# Patient Record
Sex: Male | Born: 1950
Health system: Southern US, Community
[De-identification: ages and names within clinical notes are randomized; demographics above are authoritative.]

## PROBLEM LIST (undated history)

## (undated) ENCOUNTER — Emergency Department (HOSPITAL_COMMUNITY): Discharge status: Absent without leave | Payer: Medicare Other

## (undated) DIAGNOSIS — I10 Essential (primary) hypertension: Secondary | ICD-10-CM

## (undated) HISTORY — PX: JOINT REPLACEMENT: SHX530

---

## 1999-09-12 ENCOUNTER — Emergency Department (HOSPITAL_COMMUNITY): Admission: EM | Admit: 1999-09-12 | Discharge: 1999-09-12 | Payer: Self-pay | Admitting: *Deleted

## 2007-08-06 ENCOUNTER — Emergency Department (HOSPITAL_COMMUNITY): Admission: EM | Admit: 2007-08-06 | Discharge: 2007-08-06 | Payer: Self-pay | Admitting: Internal Medicine

## 2011-05-16 ENCOUNTER — Emergency Department (HOSPITAL_COMMUNITY): Payer: Medicare Other

## 2011-05-16 ENCOUNTER — Inpatient Hospital Stay (HOSPITAL_COMMUNITY)
Admission: EM | Admit: 2011-05-16 | Discharge: 2011-05-21 | DRG: 470 | Disposition: A | Discharge status: Skilled Nursing Facility | Payer: Medicare Other | Attending: Orthopedic Surgery | Admitting: Orthopedic Surgery

## 2011-05-16 DIAGNOSIS — K219 Gastro-esophageal reflux disease without esophagitis: Secondary | ICD-10-CM | POA: Diagnosis present

## 2011-05-16 DIAGNOSIS — S72002A Fracture of unspecified part of neck of left femur, initial encounter for closed fracture: Secondary | ICD-10-CM | POA: Diagnosis present

## 2011-05-16 DIAGNOSIS — S72009A Fracture of unspecified part of neck of unspecified femur, initial encounter for closed fracture: Principal | ICD-10-CM | POA: Diagnosis present

## 2011-05-16 DIAGNOSIS — I10 Essential (primary) hypertension: Secondary | ICD-10-CM | POA: Diagnosis present

## 2011-05-16 LAB — CBC
HCT: 38.2 % — ABNORMAL LOW (ref 39.0–52.0)
Hemoglobin: 12.5 g/dL — ABNORMAL LOW (ref 13.0–17.0)
MCH: 29.3 pg (ref 26.0–34.0)
MCHC: 32.7 g/dL (ref 30.0–36.0)
MCV: 89.7 fL (ref 78.0–100.0)
Platelets: 225 10*3/uL (ref 150–400)
RBC: 4.26 MIL/uL (ref 4.22–5.81)
RDW: 13.8 % (ref 11.5–15.5)
WBC: 20.8 10*3/uL — ABNORMAL HIGH (ref 4.0–10.5)

## 2011-05-16 LAB — DIFFERENTIAL
Basophils Absolute: 0 10*3/uL (ref 0.0–0.1)
Basophils Relative: 0 % (ref 0–1)
Eosinophils Absolute: 0 10*3/uL (ref 0.0–0.7)
Eosinophils Relative: 0 % (ref 0–5)
Lymphocytes Relative: 3 % — ABNORMAL LOW (ref 12–46)
Lymphs Abs: 0.5 10*3/uL — ABNORMAL LOW (ref 0.7–4.0)
Monocytes Absolute: 0.7 10*3/uL (ref 0.1–1.0)
Monocytes Relative: 3 % (ref 3–12)
Neutro Abs: 19.6 10*3/uL — ABNORMAL HIGH (ref 1.7–7.7)
Neutrophils Relative %: 94 % — ABNORMAL HIGH (ref 43–77)

## 2011-05-16 LAB — COMPREHENSIVE METABOLIC PANEL
ALT: 15 U/L (ref 0–53)
Alkaline Phosphatase: 71 U/L (ref 39–117)
BUN: 11 mg/dL (ref 6–23)
CO2: 24 mEq/L (ref 19–32)
GFR calc Af Amer: >90 mL/min (ref >90)
GFR calc non Af Amer: 78 mL/min — ABNORMAL LOW (ref >90)
Glucose, Bld: 118 mg/dL — ABNORMAL HIGH (ref 70–99)
Potassium: 3.3 mEq/L — ABNORMAL LOW (ref 3.5–5.1)
Sodium: 140 mEq/L (ref 135–145)
Total Bilirubin: 0.4 mg/dL (ref 0.3–1.2)
Total Protein: 6.5 g/dL (ref 6.0–8.3)

## 2011-05-16 LAB — APTT: aPTT: 30 s (ref 24–37)

## 2011-05-16 LAB — PROTIME-INR: Prothrombin Time: 14.4 seconds (ref 11.6–15.2)

## 2011-05-17 LAB — BASIC METABOLIC PANEL
BUN: 10 mg/dL (ref 6–23)
Chloride: 106 mEq/L (ref 96–112)
GFR calc Af Amer: >90 mL/min (ref >90)
GFR calc non Af Amer: 89 mL/min — ABNORMAL LOW (ref >90)
Potassium: 3.5 mEq/L (ref 3.5–5.1)
Sodium: 138 mEq/L (ref 135–145)

## 2011-05-17 LAB — CBC
HCT: 28.9 % — ABNORMAL LOW (ref 39.0–52.0)
MCHC: 33.6 g/dL (ref 30.0–36.0)
Platelets: 158 10*3/uL (ref 150–400)
RDW: 14 % (ref 11.5–15.5)
WBC: 15.4 10*3/uL — ABNORMAL HIGH (ref 4.0–10.5)
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
## 2011-05-18 LAB — BASIC METABOLIC PANEL
CO2: 21 mEq/L (ref 19–32)
Calcium: 7.6 mg/dL — ABNORMAL LOW (ref 8.4–10.5)
Chloride: 106 mEq/L (ref 96–112)
Glucose, Bld: 125 mg/dL — ABNORMAL HIGH (ref 70–99)
Potassium: 3.5 mEq/L (ref 3.5–5.1)
Sodium: 136 mEq/L (ref 135–145)

## 2011-05-18 LAB — CBC
Hemoglobin: 8 g/dL — ABNORMAL LOW (ref 13.0–17.0)
MCV: 88.6 fL (ref 78.0–100.0)
Platelets: 122 10*3/uL — ABNORMAL LOW (ref 150–400)
RBC: 2.72 MIL/uL — ABNORMAL LOW (ref 4.22–5.81)
WBC: 13 10*3/uL — ABNORMAL HIGH (ref 4.0–10.5)

## 2011-05-18 MED ORDER — DIPHENHYDRAMINE HCL 25 MG PO CAPS
25.0000 mg | ORAL_CAPSULE | ORAL | Status: DC | PRN
  Administered 2011-05-19 (×2): 25 mg via ORAL
  Filled 2011-05-18: qty 1

## 2011-05-18 MED ORDER — HYDROCODONE-ACETAMINOPHEN 5-325 MG PO TABS
1.0000 | ORAL_TABLET | ORAL | Status: DC | PRN
  Administered 2011-05-19: 1 via ORAL
  Administered 2011-05-19 – 2011-05-21 (×4): 2 via ORAL
  Filled 2011-05-18 (×4): qty 2

## 2011-05-18 MED ORDER — TEMAZEPAM 15 MG PO CAPS: 15.0000 mg | ORAL_CAPSULE | Freq: Every evening | ORAL | Status: DC | PRN

## 2011-05-18 MED ORDER — DOCUSATE SODIUM 100 MG PO CAPS
100.0000 mg | ORAL_CAPSULE | Freq: Two times a day (BID) | ORAL | Status: DC
  Administered 2011-05-19 – 2011-05-21 (×5): 100 mg via ORAL
  Filled 2011-05-18 (×6): qty 1

## 2011-05-18 MED ORDER — ONDANSETRON HCL 4 MG/2ML IJ SOLN: 4.0000 mg | Freq: Four times a day (QID) | INTRAMUSCULAR | Status: DC | PRN

## 2011-05-18 MED ORDER — ONDANSETRON HCL 4 MG PO TABS: 4.0000 mg | ORAL_TABLET | Freq: Four times a day (QID) | ORAL | Status: DC | PRN

## 2011-05-18 MED ORDER — SODIUM CHLORIDE 0.9 % IV SOLN
INTRAVENOUS | Status: DC
  Filled 2011-05-18 (×7): qty 1000

## 2011-05-18 MED ORDER — SENNOSIDES-DOCUSATE SODIUM 8.6-50 MG PO TABS: 1.0000 | ORAL_TABLET | Freq: Every evening | ORAL | Status: DC | PRN

## 2011-05-18 MED ORDER — ZOLPIDEM TARTRATE 5 MG PO TABS: 5.0000 mg | ORAL_TABLET | Freq: Every evening | ORAL | Status: DC | PRN

## 2011-05-18 MED ORDER — BISACODYL 10 MG RE SUPP: 10.0000 mg | Freq: Every day | RECTAL | Status: DC | PRN

## 2011-05-18 MED ORDER — HYDROMORPHONE HCL PF 1 MG/ML IJ SOLN: 0.5000 mg | INTRAMUSCULAR | Status: DC | PRN

## 2011-05-18 MED ORDER — PHENOL 1.4 % MT LIQD
1.0000 | OROMUCOSAL | Status: DC | PRN
Start: 2011-05-18 — End: 2011-05-21

## 2011-05-18 MED ORDER — METOCLOPRAMIDE HCL 10 MG PO TABS: 5.0000 mg | ORAL_TABLET | Freq: Three times a day (TID) | ORAL | Status: DC | PRN

## 2011-05-18 MED ORDER — FERROUS SULFATE 325 (65 FE) MG PO TABS
325.0000 mg | ORAL_TABLET | Freq: Three times a day (TID) | ORAL | Status: DC
  Administered 2011-05-19 – 2011-05-21 (×6): 325 mg via ORAL
  Filled 2011-05-18 (×14): qty 1

## 2011-05-18 MED ORDER — ACETAMINOPHEN 650 MG RE SUPP: 325.0000 mg | RECTAL | Status: DC | PRN

## 2011-05-18 MED ORDER — METHOCARBAMOL 500 MG PO TABS
500.0000 mg | ORAL_TABLET | Freq: Four times a day (QID) | ORAL | Status: DC | PRN
  Administered 2011-05-19 (×2): 500 mg via ORAL
  Filled 2011-05-18 (×2): qty 1

## 2011-05-18 MED ORDER — METOCLOPRAMIDE HCL 5 MG/ML IJ SOLN
10.0000 mg | Freq: Three times a day (TID) | INTRAMUSCULAR | Status: DC | PRN
  Filled 2011-05-18: qty 2

## 2011-05-18 MED ORDER — ACETAMINOPHEN 325 MG PO TABS: 325.0000 mg | ORAL_TABLET | ORAL | Status: DC | PRN

## 2011-05-18 MED ORDER — ENOXAPARIN SODIUM 40 MG/0.4ML ~~LOC~~ SOLN
40.0000 mg | SUBCUTANEOUS | Status: DC
  Administered 2011-05-19 – 2011-05-20 (×2): 40 mg via SUBCUTANEOUS
  Filled 2011-05-18 (×4): qty 0.4

## 2011-05-18 MED ORDER — METHOCARBAMOL 100 MG/ML IJ SOLN
500.0000 mg | Freq: Four times a day (QID) | INTRAVENOUS | Status: DC | PRN
  Filled 2011-05-18: qty 5

## 2011-05-18 MED ORDER — BISACODYL 5 MG PO TBEC: 10.0000 mg | DELAYED_RELEASE_TABLET | Freq: Every day | ORAL | Status: DC | PRN

## 2011-05-18 MED ORDER — MENTHOL 3 MG MT LOZG: 1.0000 | LOZENGE | OROMUCOSAL | Status: DC | PRN

## 2011-05-18 MED ORDER — FLEET ENEMA 7-19 GM/118ML RE ENEM: 1.0000 | ENEMA | Freq: Every day | RECTAL | Status: DC | PRN

## 2011-05-18 MED ORDER — ALUM & MAG HYDROXIDE-SIMETH 200-200-20 MG/5ML PO SUSP: 30.0000 mL | ORAL | Status: DC | PRN

## 2011-05-19 NOTE — Progress Notes (Signed)
Physical Therapy Treatment Patient Details Name: Leonard Green MRN: [id number] DOB: 04-30-1951 Today's Date: 05/19/2011  PT Assessment/Plan  PT - Assessment/Plan Comments on Treatment Session: Pt independently recalled 2/3 hip precautions.  Verbal cues required throughout session to maintain precautions with mobility/activity. PT Plan: Discharge plan remains appropriate;Frequency remains appropriate PT Frequency: 7X/week Follow Up Recommendations: Skilled nursing facility;Home health PT (pending progress) Equipment Recommended: Rolling walker with 5" wheels PT Goals  Acute Rehab PT Goals PT Goal Formulation: With patient Time For Goal Achievement: 7 days Pt will go Supine/Side to Sit: with supervision PT Goal: Supine/Side to Sit - Progress: Progressing toward goal Pt will Transfer Sit to Stand/Stand to Sit: with supervision PT Transfer Goal: Sit to Stand/Stand to Sit - Progress: Progressing toward goal Pt will Transfer Bed to Chair/Chair to Bed: with supervision PT Transfer Goal: Bed to Chair/Chair to Bed - Progress: Progressing toward goal Pt will Ambulate: >150 feet;with supervision;with rolling walker PT Goal: Ambulate - Progress: Progressing toward goal Additional Goals Additional Goal #1: Pt will be able to verbalize and follow 3/3 hip precaution goals 100% of time.  PT Treatment Precautions/Restrictions  Precautions Precautions: Posterior Hip Required Braces or Orthoses: Yes Knee Immobilizer: Other (comment) (when in bed) Restrictions Weight Bearing Restrictions: Yes LLE Weight Bearing: Weight bearing as tolerated Mobility (including Balance) Bed Mobility Bed Mobility: Yes Supine to Sit: 4: Min assist;HOB elevated (Comment degrees) (HOB 30 degrees) Supine to Sit Details (indicate cue type and reason): verbal/tactile cues for sequencing/precautions Transfers Transfers: Yes Sit to Stand: 4: Min assist Sit to Stand Details (indicate cue type and reason): verbal cues  for hand placement, tactile cues for forward weight shift Stand to Sit: 4: Min assist Stand to Sit Details: assist to slide left leg forward during descent, verbal cues for sequencing Ambulation/Gait Ambulation/Gait: Yes Ambulation/Gait Assistance: 4: Min assist Ambulation Distance (Feet): 110 Feet Assistive device: Rolling walker Gait Pattern: Antalgic Stairs: No Wheelchair Mobility Wheelchair Mobility: No    Exercise  Total Joint Exercises Ankle Circles/Pumps: AROM;Right;Left;10 reps;Supine Quad Sets: AROM;Left;10 reps;Supine (neuromuscular facilitation to complete) Heel Slides: AAROM;Left;10 reps;Supine Hip ABduction/ADduction: AAROM;Left;10 reps;Supine End of Session PT - End of Session Equipment Utilized During Treatment: Gait belt Activity Tolerance: Patient tolerated treatment well Patient left: in chair;with call bell in reach General Behavior During Session: [facility] for tasks performed Cognition: [facility] for tasks performed  Ilda Foil 05/19/2011, 9:02 AM

## 2011-05-19 NOTE — Progress Notes (Signed)
Physical Therapy Treatment Patient Details Name: Leonard Green MRN: [id number] DOB: 11-05-1950 Today's Date: 05/19/2011  PT Assessment/Plan  PT - Assessment/Plan Comments on Treatment Session: Reviewed 3/3 hip precautions.  Pt needs verbal cues during mobility to maintain precautions. PT Plan: Discharge plan remains appropriate PT Frequency: 7X/week Follow Up Recommendations: Skilled nursing facility;Home health PT (pending progress) Equipment Recommended: Rolling walker with 5" wheels PT Goals  Acute Rehab PT Goals PT Goal: Supine/Side to Sit - Progress: Other (comment) (did not assess) PT Transfer Goal: Sit to Stand/Stand to Sit - Progress: Progressing toward goal PT Transfer Goal: Bed to Chair/Chair to Bed - Progress: Progressing toward goal PT Goal: Ambulate - Progress: Progressing toward goal  PT Treatment Precautions/Restrictions  Precautions Precautions: Posterior Hip Required Braces or Orthoses: Yes Knee Immobilizer: Other (comment) (when in bed) Restrictions Weight Bearing Restrictions: Yes LLE Weight Bearing: Weight bearing as tolerated Mobility (including Balance) Transfers Sit to Stand: 3: Mod assist;From chair/3-in-1 Sit to Stand Details (indicate cue type and reason): verbal/tactile cues for technique/precautions Stand to Sit: 4: Min assist;To chair/3-in-1 Stand to Sit Details: verbal cues for sequencing Ambulation/Gait Ambulation/Gait: Yes Ambulation/Gait Assistance: 5: Supervision Ambulation Distance (Feet): 215 Feet Assistive device: Rolling walker    Exercise  Total Joint Exercises Ankle Circles/Pumps: AROM;Both;10 reps;Seated;Other (comment) (reclined) Quad Sets: AROM;Left;10 reps (neuromuscular facilitation to complete) Heel Slides: AAROM;Left;10 reps Hip ABduction/ADduction: AAROM;Left;10 reps End of Session PT - End of Session Equipment Utilized During Treatment: Gait belt Activity Tolerance: Patient tolerated treatment well Patient left: in  chair;with call bell in reach;with family/visitor present General Behavior During Session: [facility] for tasks performed Cognition: [facility] for tasks performed  Ilda Foil 05/19/2011, 1:59 PM

## 2011-05-19 NOTE — Progress Notes (Signed)
Leonard Green  MRN: [id number] DOB/Age: 11-04-1950 60 y.o. Physician: Jacquelyne Balint Procedure:       Subjective: Fair appetite and improving hip pain.  Vital Signs Temp:  [98.5 F (36.9 C)] 98.5 F (36.9 C) (11/04 0515) Pulse Rate:  [82] 82  (11/04 0515) Resp:  [22] 22  (11/04 0515) BP: (116)/(71) 116/71 mmHg (11/04 0515) SpO2:  [97 %] 97 % (11/04 0515)  Lab Results  Basename 05/18/11 0630 05/17/11 0608  WBC 13.0* 15.4*  HGB 8.0* 9.7*  HCT 24.1* 28.9*  PLT 122* 158   BMET  Basename 05/18/11 0630 05/17/11 0608  NA 136 138  K 3.5 3.5  CL 106 106  CO2 21 23  GLUCOSE 125* 156*  BUN 8 10  CREATININE 0.91 0.95  CALCIUM 7.6* 7.8*   INR  Date Value Range Status  05/16/2011 1.10  0.00-1.49 (no units) Final     Exam Dressing to left hip dry. Thigh soft NVI        Plan Mobilize with PT DC plans, needs social worker consult re: options   Leonard Green for Dr.Kevin Supple 05/19/2011, 9:42 AM

## 2011-05-20 NOTE — Progress Notes (Signed)
Met with patient and he is agreeable to SNF at d/c. Full CSW Assessment in shadow chart along with FL2 for MD signature.   Reece Levy, MSW, Theresia Majors [phone]

## 2011-05-20 NOTE — Progress Notes (Addendum)
Physical Therapy Treatment Patient Details Name: Leonard Green MRN: [id number] DOB: 1951/04/11 Today's Date: 05/20/2011  PT Assessment/Plan  PT - Assessment/Plan Comments on Treatment Session: Pt. able to verbalize 3/3 post hip precautions but required to maintain precautions with mobility. Pt ambulating well but still requring assitance for bed mobility and transfers. PT Plan: Discharge plan remains appropriate PT Goals  Acute Rehab PT Goals PT Goal: Supine/Side to Sit - Progress: Progressing toward goal PT Transfer Goal: Sit to Stand/Stand to Sit - Progress: Progressing toward goal PT Transfer Goal: Bed to Chair/Chair to Bed - Progress: Progressing toward goal PT Goal: Ambulate - Progress: Met  PT Treatment Precautions/Restrictions  Precautions Precautions: Posterior Hip Required Braces or Orthoses: No Knee Immobilizer: Other (comment) (when in bed) Restrictions Weight Bearing Restrictions: Yes LLE Weight Bearing: Weight bearing as tolerated Mobility (including Balance) Bed Mobility Bed Mobility: Yes Supine to Sit: 4: Min assist Supine to Sit Details (indicate cue type and reason): cues to keep LLE straight and not turned in. A to position LLE. Transfers Transfers: Yes Sit to Stand: 3: Mod assist;From bed Sit to Stand Details (indicate cue type and reason): A to inititate stand. Pt having difficulty Stand to Sit: 4: Min assist Stand to Sit Details: Pt required cues and assistance to slide out LLE.  Ambulation/Gait Ambulation/Gait: Yes Ambulation/Gait Assistance: 5: Supervision Ambulation/Gait Assistance Details (indicate cue type and reason): Cues for upright posture and safe positioning of RW Ambulation Distance (Feet): 225 Feet Assistive device: Rolling walker Gait Pattern: Step-through pattern;Antalgic Gait velocity: Pt with decreased cadence. Stairs: No Wheelchair Mobility Wheelchair Mobility: No    Exercise  Total Joint Exercises Ankle Circles/Pumps:  AROM;10 reps;Seated;Left Quad Sets: Seated;10 reps;AROM;Strengthening;Left Heel Slides: AAROM;Strengthening;10 reps;Seated;Left Hip ABduction/ADduction: AAROM;Strengthening;Left;10 reps;Seated Long Arc Quad: Strengthening;AROM;Left;10 reps;Seated End of Session PT - End of Session Equipment Utilized During Treatment: Gait belt Activity Tolerance: Patient tolerated treatment well Patient left: in chair;with call bell in reach Nurse Communication: Mobility status for ambulation General Behavior During Session: [facility] for tasks performed Cognition: [facility] for tasks performed  Leonard Green, Leonard Green 05/20/2011, 10:46 AM

## 2011-05-21 DIAGNOSIS — S72002A Fracture of unspecified part of neck of left femur, initial encounter for closed fracture: Secondary | ICD-10-CM | POA: Diagnosis present

## 2011-05-21 MED ORDER — DSS 100 MG PO CAPS: 100.0000 mg | ORAL_CAPSULE | Freq: Two times a day (BID) | ORAL | Status: AC

## 2011-05-21 MED ORDER — BISACODYL 5 MG PO TBEC: 10.0000 mg | DELAYED_RELEASE_TABLET | Freq: Every day | ORAL | Status: AC | PRN

## 2011-05-21 MED ORDER — METHOCARBAMOL 500 MG PO TABS: 500.0000 mg | ORAL_TABLET | Freq: Four times a day (QID) | ORAL | Status: AC | PRN

## 2011-05-21 MED ORDER — ACETAMINOPHEN 325 MG PO TABS: 325.0000 mg | ORAL_TABLET | ORAL | Status: AC | PRN

## 2011-05-21 MED ORDER — SENNOSIDES-DOCUSATE SODIUM 8.6-50 MG PO TABS: 1.0000 | ORAL_TABLET | Freq: Every evening | ORAL | Status: AC | PRN

## 2011-05-21 MED ORDER — HYDROCODONE-ACETAMINOPHEN 5-325 MG PO TABS: 1.0000 | ORAL_TABLET | ORAL | Status: AC | PRN

## 2011-05-21 MED ORDER — DIPHENHYDRAMINE HCL 25 MG PO CAPS: 25.0000 mg | ORAL_CAPSULE | ORAL | Status: AC | PRN

## 2011-05-21 MED ORDER — MENTHOL 3 MG MT LOZG: 1.0000 | LOZENGE | OROMUCOSAL | Status: AC | PRN

## 2011-05-21 MED ORDER — ENOXAPARIN SODIUM 40 MG/0.4ML ~~LOC~~ SOLN: 40.0000 mg | SUBCUTANEOUS | Status: AC

## 2011-05-21 MED ORDER — HYDROCODONE-ACETAMINOPHEN 5-325 MG PO TABS: 1.0000 | ORAL_TABLET | ORAL | Status: DC | PRN

## 2011-05-21 MED ORDER — ALUM & MAG HYDROXIDE-SIMETH 200-200-20 MG/5ML PO SUSP: 30.0000 mL | ORAL | Status: AC | PRN

## 2011-05-21 MED ORDER — FERROUS SULFATE 325 (65 FE) MG PO TABS: 325.0000 mg | ORAL_TABLET | Freq: Three times a day (TID) | ORAL | Status: DC

## 2011-05-21 MED ORDER — TEMAZEPAM 15 MG PO CAPS: 15.0000 mg | ORAL_CAPSULE | Freq: Every evening | ORAL | Status: AC | PRN

## 2011-05-21 NOTE — Progress Notes (Signed)
Patient for d/c today to SNF bed at [facility]. Patient's mother assisting with paperwork at SNF and with Medicaid application. Plan transfer via EMS. Reece Levy, MSW, Theresia Majors [phone]

## 2011-05-21 NOTE — Discharge Summary (Signed)
Physician Discharge Summary  Patient ID: Leonard Green MRN: [id number] DOB/AGE: Apr 17, 1951 60 y.o.  Admit date: 05/16/2011 Discharge date: 05/21/2011  Admission Diagnoses: Left femoral neck fracture   Discharge Diagnoses: Left Femoral neck fracture Principal Problem:  *Fracture of femoral neck, left   Discharged Condition: good  Hospital Course: Admitted for left hip fracture, 05/16/2011 and taken to the operating room for a THR based on his age and condition.  After routine stay in the recovery room he was transferred to the Orthopedic floor where he remained for his whole hospital stay.  On post op day 1 his foley catheter and HV drain were removed.  He was seen and evaluated by PT/OT.   There were no major complicating events.  He did not receive nor need a transfusion.  By post op day 5 arrangements had been made by Social Work to be transferred to the [facility].  His wound was clean and dry.  Consults: none   Treatments: surgery:  Left total hip replacement, 05/16/2011  Discharge Exam: Blood pressure 106/45, pulse 75, temperature 99.5 F (37.5 C), temperature source Oral, resp. rate 16, height 5\' 5"  (1.651 m), weight 61.23 kg (134 lb 15.8 oz), SpO2 100.00%.   Disposition: To SNF  Discharge Orders    Future Orders Please Complete By Expires   Diet - low sodium heart healthy      Constipation Prevention      Comments:   Drink plenty of fluids.  Prune juice may be helpful.  You may use a stool softener, such as Colace (over the counter) 100 mg twice a day.  Use MiraLax (over the counter) for constipation as needed.   Increase activity slowly as tolerated      Weight Bearing as taught in Physical Therapy      Comments:   Use a walker or crutches as instructed.   Call MD / Call 911      Comments:   If you experience chest pain or shortness of breath, CALL 911 and be transported to the hospital emergency room.  If you develope a fever above 101 F, pus (white  drainage) or increased drainage or redness at the wound, or calf pain, call your surgeon's office.   Discharge instructions      Comments:   Keep wound dry until follow up.  Maintain surgical dressing for 8 days postoperative, then remove and cover with gauze and tape.   Driving restrictions      Comments:   No driving for 4 weeks   Do not sit on low chairs, stoools or toilet seats, as it may be difficult to get up from low surfaces      DO NOT drive, shower or take a tub bath until instructed by your physician      Discharge wound care:      Comments:   If you have a hip bandage, keep it clean and dry.  Change your bandage as instructed by your health care providers.  If your bandage has been discontinued, keep your incision clean and dry.  Pat dry after bathing.  DO NOT put lotion or powder on your incision.     Current Discharge Medication List    START taking these medications   Details  acetaminophen (TYLENOL) 325 MG tablet Take 1-2 tablets (325-650 mg total) by mouth every 4 (four) hours as needed. Qty: 30 tablet, Refills: 0    alum & mag hydroxide-simeth (MAALOX/MYLANTA) 200-200-20 MG/5ML suspension Take 30 mLs by  mouth every 4 (four) hours as needed. Qty: 355 mL, Refills: 0    bisacodyl (DULCOLAX) 5 MG EC tablet Take 2 tablets (10 mg total) by mouth daily as needed for constipation. Qty: 30 tablet, Refills: 0    diphenhydrAMINE (BENADRYL) 25 mg capsule Take 1 capsule (25 mg total) by mouth every 4 (four) hours as needed for itching. Qty: 30 capsule, Refills: 0    docusate sodium 100 MG CAPS Take 100 mg by mouth 2 (two) times daily. Qty: 60 capsule, Refills: 0    enoxaparin (LOVENOX) 40 MG/0.4ML SOLN Inject 0.4 mLs (40 mg total) into the skin daily. Qty: 11.2 mL, Refills: 0    ferrous sulfate 325 (65 FE) MG tablet Take 1 tablet (325 mg total) by mouth 3 (three) times daily after meals. Qty: 90 tablet, Refills: 0    HYDROcodone-acetaminophen (NORCO) 5-325 MG per tablet  Take 1-2 tablets by mouth every 4 (four) hours as needed. Qty: 100 tablet, Refills: 0    menthol-cetylpyridinium (CEPACOL) 3 MG lozenge Take 1 lozenge (3 mg total) by mouth as needed. Qty: 100 tablet, Refills: 0    methocarbamol (ROBAXIN) 500 MG tablet Take 1 tablet (500 mg total) by mouth every 6 (six) hours as needed. Qty: 90 tablet, Refills: 1    senna-docusate (SENOKOT-S) 8.6-50 MG per tablet Take 1 tablet by mouth at bedtime as needed for constipation. Qty: 30 tablet, Refills: 0    temazepam (RESTORIL) 15 MG capsule Take 1-2 capsules (15-30 mg total) by mouth at bedtime as needed for sleep. Qty: 30 capsule, Refills: 0       Follow-up Information    Follow up with Shayda Kalka D. Call in 2 weeks.   Contact information:   [facility] [address], [address] [place] [address] [phone]          Signed: [name] 05/21/2011, 3:25 PM

## 2011-05-21 NOTE — Progress Notes (Signed)
Occupational Therapy Treatment Patient Details Name: Leonard Green MRN: [id number] DOB: 01/31/1951 Today's Date: 05/21/2011  OT Assessment/Plan OT Assessment/Plan Comments on Treatment Session: Pt progressing well towards goals. Pt plans to d/c to SNF this afternoon. OT Plan: Discharge plan remains appropriate OT Goals    OT Treatment Precautions/Restrictions  Precautions Precautions: Posterior Hip Precaution Comments: Pt able to verbalize 3/3 posterior hip precautions without cues Required Braces or Orthoses: No Restrictions Weight Bearing Restrictions: Yes LLE Weight Bearing: Weight bearing as tolerated   ADL ADL Toilet Transfer: Performed;Other (comment) (Min guard assist) Toilet Transfer Method: Proofreader: Raised toilet seat with arms (or 3-in-1 over toilet) Toileting - Clothing Manipulation: Performed;Supervision/safety Toileting - Clothing Manipulation Details (indicate cue type and reason): Pt pulled up gown for use of urinal while standing at toilet Where Assessed - Glass blower/designer Manipulation: Standing Toileting - Hygiene: Performed;Supervision/safety Toileting - Hygiene Details (indicate cue type and reason): Pt held urinal while standing with UE support on RW with supervision Where Assessed - Toileting Hygiene: Standing Equipment Used: Rolling walker ADL Comments: Pt performed toileting task while standing with supervision with urinal. Pt performed toilet transfer to 3-in-1 over toilet in prep for toileting ADL. Mobility  Bed Mobility Bed Mobility: No Transfers Transfers: Yes Sit to Stand: 4: Min assist;From chair/3-in-1 Stand to Sit: Other (comment);To chair/3-in-1 (Pt performed stand to sit to chair/3-in1 with min guard) Exercises    End of Session OT - End of Session Equipment Utilized During Treatment: Gait belt Activity Tolerance: Patient limited by pain Patient left: in chair;with call bell in reach;with  family/visitor present General Behavior During Session: [facility] for tasks performed Cognition: [facility] for tasks performed  Cipriano Mile  05/21/2011, 1:06 PM

## 2011-05-21 NOTE — Progress Notes (Signed)
  POD # 5 from Left THR for femoral neck fracture  Subjective: Doing well.  No events or major concerns. Reviewed procedure/components.  Ready for transfer to facility to continue working on progress   Objective: Vital signs in last 24 hours: Temp:  [99.2 F (37.3 C)-99.9 F (37.7 C)] 99.9 F (37.7 C) (11/06 0543) Pulse Rate:  [75-79] 75  (11/06 0543) Resp:  [18] 18  (11/06 0543) BP: (111-142)/(54-73) 142/68 mmHg (11/06 0543) SpO2:  [96 %-99 %] 99 % (11/06 0543)  No results found for this basename: HGB:5 in the last 72 hours No results found for this basename: WBC:2,RBC:2,HCT:2,PLT:2 in the last 72 hours No results found for this basename: NA:2,K:2,CL:2,CO2:2,BUN:2,CREATININE:2,GLUCOSE:2,CALCIUM:2 in the last 72 hours No results found for this basename: LABPT:2,INR:2 in the last 72 hours  Neurovascular intact Dorsiflexion/Plantar flexion intact Incision: dressing C/D/I  Assessment/Plan: Doing well.  Plan is to transfer to SNF today pending discharge coordination   Royalty Fakhouri D 05/21/2011, 7:43 AM

## 2011-05-21 NOTE — Progress Notes (Signed)
Pt D/Cd to [facility] via EMS.

## 2011-05-21 NOTE — Progress Notes (Signed)
Physical Therapy Treatment Patient Details Name: Leonard Green MRN: [id number] DOB: [birth year] Today's Date: 05/21/2011  PT Assessment/Plan  PT - Assessment/Plan Comments on Treatment Session: Pt. continuing to require assistance with bed mobility and transfers. Pt is hopeful for D/C to [facility] this afternoon PT Plan: Discharge plan remains appropriate PT Goals  Acute Rehab PT Goals PT Goal: Supine/Side to Sit - Progress: Progressing toward goal PT Transfer Goal: Sit to Stand/Stand to Sit - Progress: Progressing toward goal PT Transfer Goal: Bed to Chair/Chair to Bed - Progress: Progressing toward goal PT Goal: Ambulate - Progress: Met  PT Treatment Precautions/Restrictions  Precautions Precautions: Posterior Hip Precaution Comments: Pt able to verbalize 3/3 posterior hip precautions without cues Required Braces or Orthoses: No Knee Immobilizer: Other (comment) (when in bed) Restrictions Weight Bearing Restrictions: Yes LLE Weight Bearing: Weight bearing as tolerated Mobility (including Balance) Bed Mobility Supine to Sit: 4: Min assist;HOB elevated (Comment degrees) (25 degrees) Supine to Sit Details (indicate cue type and reason): A required to position LLE. Cues for safe technique. Pt. with heavy reliance on rails. C/O increase elbow pain secondary to fall.  Transfers Transfers: Yes Sit to Stand: 4: Min assist Sit to Stand Details (indicate cue type and reason): A to initate stand and cues for safe hand placement Stand to Sit: 4: Min assist Stand to Sit Details: Cues to slide out LLE.  Ambulation/Gait Ambulation/Gait: Yes Ambulation/Gait Assistance: 5: Supervision Ambulation/Gait Assistance Details (indicate cue type and reason): Cues to roll RW vs. picking walker up. Cues to look forward. Ambulation Distance (Feet): 225 Feet Assistive device: Rolling walker Gait Pattern: Step-through pattern;Antalgic    Exercise  Total Joint Exercises Heel Slides:  AAROM;Strengthening;Left;15 reps;Seated Hip ABduction/ADduction: Seated;15 reps;Left;Strengthening;AAROM Long Arc Quad: AAROM;Strengthening;Left;15 reps;Seated End of Session PT - End of Session Equipment Utilized During Treatment: Gait belt Activity Tolerance: Patient tolerated treatment well Patient left: in chair;with call bell in reach General Behavior During Session: [facility] for tasks performed Cognition: [facility] for tasks performed  Fredrich Birks 05/21/2011, 8:48 AM

## 2016-12-19 DIAGNOSIS — H9041 Sensorineural hearing loss, unilateral, right ear, with unrestricted hearing on the contralateral side: Secondary | ICD-10-CM | POA: Diagnosis not present

## 2017-03-26 DIAGNOSIS — H04123 Dry eye syndrome of bilateral lacrimal glands: Secondary | ICD-10-CM | POA: Diagnosis not present

## 2017-03-26 DIAGNOSIS — H35033 Hypertensive retinopathy, bilateral: Secondary | ICD-10-CM | POA: Diagnosis not present

## 2018-02-21 ENCOUNTER — Emergency Department (HOSPITAL_COMMUNITY): Payer: PPO

## 2018-02-21 ENCOUNTER — Encounter (HOSPITAL_COMMUNITY): Payer: Self-pay

## 2018-02-21 ENCOUNTER — Emergency Department (HOSPITAL_COMMUNITY)
Admission: EM | Admit: 2018-02-21 | Discharge: 2018-02-21 | Disposition: A | Discharge status: Home / Self Care | Payer: PPO | Attending: Emergency Medicine | Admitting: Emergency Medicine

## 2018-02-21 ENCOUNTER — Other Ambulatory Visit: Payer: Self-pay

## 2018-02-21 DIAGNOSIS — Z96649 Presence of unspecified artificial hip joint: Secondary | ICD-10-CM | POA: Diagnosis not present

## 2018-02-21 DIAGNOSIS — Y929 Unspecified place or not applicable: Secondary | ICD-10-CM | POA: Diagnosis not present

## 2018-02-21 DIAGNOSIS — Y939 Activity, unspecified: Secondary | ICD-10-CM | POA: Diagnosis not present

## 2018-02-21 DIAGNOSIS — Z23 Encounter for immunization: Secondary | ICD-10-CM | POA: Diagnosis not present

## 2018-02-21 DIAGNOSIS — W260XXA Contact with knife, initial encounter: Secondary | ICD-10-CM | POA: Diagnosis not present

## 2018-02-21 DIAGNOSIS — M79672 Pain in left foot: Secondary | ICD-10-CM | POA: Diagnosis not present

## 2018-02-21 DIAGNOSIS — M722 Plantar fascial fibromatosis: Secondary | ICD-10-CM

## 2018-02-21 DIAGNOSIS — Y999 Unspecified external cause status: Secondary | ICD-10-CM | POA: Diagnosis not present

## 2018-02-21 DIAGNOSIS — S61412A Laceration without foreign body of left hand, initial encounter: Secondary | ICD-10-CM | POA: Diagnosis not present

## 2018-02-21 MED ORDER — TETANUS-DIPHTH-ACELL PERTUSSIS 5-2.5-18.5 LF-MCG/0.5 IM SUSP
0.5000 mL | Freq: Once | INTRAMUSCULAR | Status: AC
  Administered 2018-02-21: 0.5 mL via INTRAMUSCULAR
  Filled 2018-02-21: qty 0.5

## 2018-02-21 NOTE — ED Notes (Signed)
Hemostatic gauze applied

## 2018-02-21 NOTE — ED Triage Notes (Signed)
Pt states that he accidentally cut his L hand this morning with a pocket knife, bleeding controlled, last tetanus 20 years ago

## 2018-02-21 NOTE — Discharge Instructions (Addendum)
Leave dressing in place for the next 2 days.  Try shoe inserts to help with heel pain.  Schedule appointment with primary care for evaluation of blood pressure

## 2018-02-23 NOTE — ED Provider Notes (Signed)
[facility] Provider Note   CSN: [id number] Arrival date & time: 02/21/18  2048     History   Chief Complaint Chief Complaint  Patient presents with  . Extremity Laceration    HPI Leonard HartiganClyde S Lehnert is a 67 y.o. male.  The history is provided by the patient. No language interpreter was used.  Hand Pain  This is a new problem. The current episode started 12 to 24 hours ago. The problem occurs constantly. The problem has not changed since onset.Nothing aggravates the symptoms. Nothing relieves the symptoms. He has tried nothing for the symptoms.  Pt reports he cut hand with a pocket knife.  Pt reports area has been bleeding all day Pt request a tetanus shot  Pt also complains of foot pain for several months.  Pt reports his heel hurts when he walks.  History reviewed. No pertinent past medical history.  Patient Active Problem List   Diagnosis Date Noted  . Fracture of femoral neck, left (HCC) 05/21/2011    Past Surgical History:  Procedure Laterality Date  . JOINT REPLACEMENT     hip        Home Medications    Prior to Admission medications   Medication Sig Start Date End Date Taking? Authorizing Provider  ferrous sulfate 325 (65 FE) MG tablet Take 1 tablet (325 mg total) by mouth 3 (three) times daily after meals. 05/21/11 05/20/12  [name], Matthew, MD    Family History No family history on file.  Social History Social History   Tobacco Use  . Smoking status: Never Smoker  . Smokeless tobacco: Never Used  Substance Use Topics  . Alcohol use: Never    Frequency: Never  . Drug use: Never     Allergies   Patient has no known allergies.   Review of Systems Review of Systems  Skin: Positive for wound.  All other systems reviewed and are negative.    Physical Exam Updated Vital Signs BP (!) 180/99 (BP Location: Right Arm)   Pulse 69   Temp 98.7 F (37.1 C) (Oral)   Resp 16   SpO2 100%   Physical Exam    Constitutional: He is oriented to person, place, and time. He appears well-developed and well-nourished.  Musculoskeletal: He exhibits no deformity.  3mm superficial laceration  Oozing  nv and ns intact.   Neurological: He is alert and oriented to person, place, and time.  Skin: Skin is dry.  Nursing note and vitals reviewed.    ED Treatments / Results  Labs (all labs ordered are listed, but only abnormal results are displayed) Labs Reviewed - No data to display  EKG None  Radiology Dg Foot Complete Left  Result Date: 02/21/2018 CLINICAL DATA:  Chronic left foot pain, particularly at the heel. Initial encounter. EXAM: LEFT FOOT - COMPLETE 3+ VIEW COMPARISON:  None. FINDINGS: There is no evidence of fracture or dislocation. The joint spaces are preserved. There is no evidence of talar subluxation; the subtalar joint is unremarkable in appearance. A small plantar calcaneal spur is noted. No significant soft tissue abnormalities are seen. IMPRESSION: No evidence of fracture or dislocation. Electronically Signed   By: Roanna RaiderJeffery  Chang M.D.   On: 02/21/2018 22:22    Procedures Procedures (including critical care time)  Medications Ordered in ED Medications  Tdap (BOOSTRIX) injection 0.5 mL (0.5 mLs Intramuscular Given 02/21/18 2215)     Initial Impression / Assessment and Plan / ED Course  I have reviewed  the triage vital signs and the nursing notes.  Pertinent labs & imaging results that were available during my care of the patient were reviewed by me and considered in my medical decision making (see chart for details).     Foot no sign of infection,  Pain with walking    MDM  Wound small, dirty,   Area cleaned,  Thrombipad applied and bleeding stopped.  Pt advised to leave dressing on x 24 hours.    Final Clinical Impressions(s) / ED Diagnoses   Final diagnoses:  Laceration of left hand, foreign body presence unspecified, initial encounter  Plantar fasciitis, left    ED  Discharge Orders    None    An After Visit Summary was printed and given to the patient.    Elson AreasSofia, Christo Hain K, PA-C 02/23/18 0016    Sabas SousBero, Michael M, MD 02/23/18 [phone]

## 2018-08-05 ENCOUNTER — Encounter (HOSPITAL_COMMUNITY): Payer: Self-pay | Admitting: Emergency Medicine

## 2018-08-05 ENCOUNTER — Ambulatory Visit (HOSPITAL_COMMUNITY)
Admission: EM | Admit: 2018-08-05 | Discharge: 2018-08-05 | Disposition: A | Discharge status: Home / Self Care | Payer: PPO | Attending: Family Medicine | Admitting: Family Medicine

## 2018-08-05 DIAGNOSIS — I10 Essential (primary) hypertension: Secondary | ICD-10-CM | POA: Insufficient documentation

## 2018-08-05 DIAGNOSIS — M545 Low back pain, unspecified: Secondary | ICD-10-CM

## 2018-08-05 DIAGNOSIS — S39012A Strain of muscle, fascia and tendon of lower back, initial encounter: Secondary | ICD-10-CM

## 2018-08-05 HISTORY — DX: Essential (primary) hypertension: I10

## 2018-08-05 MED ORDER — CYCLOBENZAPRINE HCL 5 MG PO TABS: ORAL_TABLET | ORAL | 0 refills | Status: DC

## 2018-08-05 NOTE — ED Triage Notes (Signed)
Pt here for lower back pain x 4 days

## 2018-08-05 NOTE — Discharge Instructions (Addendum)

## 2018-08-05 NOTE — ED Provider Notes (Signed)
[facility]   [id number] 08/05/18 Arrival Time: [id number]  ASSESSMENT & PLAN:  1. Acute left-sided low back pain without sciatica   2. Strain of lumbar region, initial encounter   3. Uncontrolled hypertension    Able to ambulate here and hemodynamically stable. No indication for imaging of back at this time given no trauma and normal neurological exam. Discussed.  Clinical Course as of Aug 06 1143  Wed Aug 05, 2018  1144 Noted and discussed at visit. Historical data shows previously elevated blood pressures. Recommend establishing care with a PCP. May check outside of clinic and return with log to discuss initiation of anti-hypertensive treatment if he wishes.  BP(!): 204/91 [BH]    Clinical Course User Index [BH] Mardella LaymanHagler, Luverne Zerkle, MD   Meds ordered this encounter  Medications  . cyclobenzaprine (FLEXERIL) 5 MG tablet    Sig: Take 1 tablet by mouth before bed as needed for muscle spasm. Warning: May cause drowsiness.    Dispense:  10 tablet    Refill:  0   Suspect muscle spasm. Medication sedation precautions given. Encourage ROM/movement as tolerated.  He does plan to call for new PCP appointment asap. Written information on HTN given. See AVS for discharge instructions.  Reviewed expectations re: course of current medical issues. Questions answered. Outlined signs and symptoms indicating need for more acute intervention. Patient verbalized understanding. After Visit Summary given.   SUBJECTIVE: History from: patient. 68yo male. 4 days; LBP; lumbar; central and left; no radiation; ache with occasional "shock" feeling; no injury/trauma; noticed after stepping into truck; ibup s relief; no extremity sensation changes or weakness from baseline (polio as child); normal bowel/bladder habits; 2 y ago similar back pain that resolved without treatment; ambulatory without difficulty; no pregressive LE weakness or saddle anesthesia; no n/v; OTC analgesics without  relief  Reports no chronic steroid use, fevers, IV drug use, or recent back surgeries or procedures.  Also notice increased BP. Without headaches, CP, SOB, LE edema, PND, orthopnea.  ROS: As per HPI. All other systems negative    OBJECTIVE:  Vitals:   08/05/18 1051  BP: (!) 204/91  Pulse: 60  Resp: 18  Temp: 97.7 F (36.5 C)  TempSrc: Oral  SpO2: 99%    General appearance: alert; no distress Neck: supple with FROM; without midline tenderness CV: RRR Lungs: CTAB Abdomen: soft, non-tender; non-distended Back: mild to moderate bilateral tenderness of his lower paraspinal musculature; FROM at waist; bruising: none; without midline tenderness Extremities: no edema; symmetrical with no gross deformities; normal ROM of bilateral lower extremities Skin: warm and dry Neurologic: normal gait; normal reflexes of RLE and LLE; normal sensation of RLE and LLE; normal strength of RLE and LLE Psychological: alert and cooperative; normal mood and affect   No Known Allergies  Past Medical History:  Diagnosis Date  . Hypertension    Social History   Socioeconomic History  . Marital status: Single    Spouse name: Not on file  . Number of children: Not on file  . Years of education: Not on file  . Highest education level: Not on file  Occupational History  . Not on file  Social Needs  . Financial resource strain: Not on file  . Food insecurity:    Worry: Not on file    Inability: Not on file  . Transportation needs:    Medical: Not on file    Non-medical: Not on file  Tobacco Use  . Smoking status: Never Smoker  . Smokeless  tobacco: Never Used  Substance and Sexual Activity  . Alcohol use: Never    Frequency: Never  . Drug use: Never  . Sexual activity: Not on file  Lifestyle  . Physical activity:    Days per week: Not on file    Minutes per session: Not on file  . Stress: Not on file  Relationships  . Social connections:    Talks on phone: Not on file    Gets  together: Not on file    Attends religious service: Not on file    Active member of club or organization: Not on file    Attends meetings of clubs or organizations: Not on file    Relationship status: Not on file  . Intimate partner violence:    Fear of current or ex partner: Not on file    Emotionally abused: Not on file    Physically abused: Not on file    Forced sexual activity: Not on file  Other Topics Concern  . Not on file  Social History Narrative  . Not on file   History reviewed. No pertinent family history. Past Surgical History:  Procedure Laterality Date  . JOINT REPLACEMENT     hip     Mardella LaymanHagler, Tayo Maute, MD 08/06/18 [phone]

## 2019-05-31 ENCOUNTER — Encounter (HOSPITAL_COMMUNITY): Payer: Self-pay

## 2019-05-31 ENCOUNTER — Ambulatory Visit (HOSPITAL_COMMUNITY)
Admission: EM | Admit: 2019-05-31 | Discharge: 2019-05-31 | Disposition: A | Discharge status: Home / Self Care | Payer: PPO | Attending: Internal Medicine | Admitting: Internal Medicine

## 2019-05-31 ENCOUNTER — Other Ambulatory Visit: Payer: Self-pay

## 2019-05-31 ENCOUNTER — Ambulatory Visit (INDEPENDENT_AMBULATORY_CARE_PROVIDER_SITE_OTHER): Payer: PPO

## 2019-05-31 DIAGNOSIS — S8012XA Contusion of left lower leg, initial encounter: Secondary | ICD-10-CM

## 2019-05-31 DIAGNOSIS — M7989 Other specified soft tissue disorders: Secondary | ICD-10-CM | POA: Diagnosis not present

## 2019-05-31 DIAGNOSIS — M25572 Pain in left ankle and joints of left foot: Secondary | ICD-10-CM | POA: Diagnosis not present

## 2019-05-31 DIAGNOSIS — L03116 Cellulitis of left lower limb: Secondary | ICD-10-CM

## 2019-05-31 DIAGNOSIS — S99912A Unspecified injury of left ankle, initial encounter: Secondary | ICD-10-CM | POA: Diagnosis not present

## 2019-05-31 MED ORDER — DOXYCYCLINE HYCLATE 100 MG PO CAPS: 100.0000 mg | ORAL_CAPSULE | Freq: Two times a day (BID) | ORAL | 0 refills | Status: DC

## 2019-05-31 NOTE — ED Provider Notes (Signed)
[facility]    CSN: [id number] Arrival date & time: 05/31/19  1254      History   Chief Complaint Chief Complaint  Patient presents with  . Hand Pain  . Leg Pain    HPI Leonard HartiganClyde S Green is a 68 y.o. male.   Leonard Green S Lindor presents with complaints of pain and redness to left lower leg. Two weeks ago while cutting a tree branch it ended up falling and landing on his left lower leg. Abrasion occurred and pain. Pain has not improved at all and now with redness to the lower leg. He recently noted some bruising to left foot, although has no pain and states that the branch did not land on his foot. He is not on a blood thinner not taking any medications currently, doesn't follow with a PCP. No numbness or tingling. Has been ambulatory. Originally had more pain with weight bearing but this has improved some. Denies any previous similar. No known MRSA history.    ROS per HPI, negative if not otherwise mentioned.      Past Medical History:  Diagnosis Date  . Hypertension     Patient Active Problem List   Diagnosis Date Noted  . Fracture of femoral neck, left (HCC) 05/21/2011    Past Surgical History:  Procedure Laterality Date  . JOINT REPLACEMENT     hip       Home Medications    Prior to Admission medications   Medication Sig Start Date End Date Taking? Authorizing Provider  cyclobenzaprine (FLEXERIL) 5 MG tablet Take 1 tablet by mouth before bed as needed for muscle spasm. Warning: May cause drowsiness. 08/05/18   Mardella LaymanHagler, Brian, MD  doxycycline (VIBRAMYCIN) 100 MG capsule Take 1 capsule (100 mg total) by mouth 2 (two) times daily. 05/31/19   Leonard Green, Leonard Yeung B, NP  ferrous sulfate 325 (65 FE) MG tablet Take 1 tablet (325 mg total) by mouth 3 (three) times daily after meals. 05/21/11 05/20/12  Leonard Green, Matthew, MD    Family History Family History  Problem Relation Age of Onset  . Cancer Mother   . Asthma Father   . Cancer Father     Social History Social  History   Tobacco Use  . Smoking status: Never Smoker  . Smokeless tobacco: Never Used  Substance Use Topics  . Alcohol use: Never    Frequency: Never  . Drug use: Never     Allergies   Patient has no known allergies.   Review of Systems Review of Systems   Physical Exam Triage Vital Signs ED Triage Vitals  Enc Vitals Group     BP 05/31/19 1340 (!) 171/124     Pulse Rate 05/31/19 1340 62     Resp 05/31/19 1340 16     Temp 05/31/19 1340 98.3 F (36.8 C)     Temp Source 05/31/19 1340 Oral     SpO2 05/31/19 1340 100 %     Weight --      Height --      Head Circumference --      Peak Flow --      Pain Score 05/31/19 1338 3     Pain Loc --      Pain Edu? --      Excl. in GC? --    No data found.  Updated Vital Signs BP (!) 171/124 (BP Location: Right Arm)   Pulse 62   Temp 98.3 F (36.8 C) (Oral)   Resp 16  SpO2 100%   Visual Acuity Right Eye Distance:   Left Eye Distance:   Bilateral Distance:    Right Eye Near:   Left Eye Near:    Bilateral Near:     Physical Exam Constitutional:      Appearance: He is well-developed.  Cardiovascular:     Rate and Rhythm: Normal rate.  Pulmonary:     Effort: Pulmonary effort is normal.  Musculoskeletal:     Left ankle: He exhibits swelling. He exhibits normal range of motion, no ecchymosis, no deformity and no laceration. Tenderness. Lateral malleolus tenderness found. Achilles tendon normal.     Left foot: Normal range of motion. No tenderness or bony tenderness.       Feet:     Comments: Ecchymosis noted to dorsum of left foot at distal 2nd and 3rd metatarsals without tenderness; left ankle with tenderness and mild swelling noted to the lateral malleolus; left lower leg with redness swelling and warmth surrounding abrasions to anterior shin; see photos; no drainage   Skin:    General: Skin is warm and dry.  Neurological:     Mental Status: He is alert and oriented to person, place, and time.           UC Treatments / Results  Labs (all labs ordered are listed, but only abnormal results are displayed) Labs Reviewed - No data to display  EKG   Radiology Dg Ankle Complete Left  Result Date: 05/31/2019 CLINICAL DATA:  Left ankle pain and swelling after injury. EXAM: LEFT ANKLE COMPLETE - 3+ VIEW COMPARISON:  None. FINDINGS: There is no evidence of fracture, dislocation, or joint effusion. There is no evidence of arthropathy or other focal bone abnormality. Mild soft tissue swelling is seen over lateral malleolus. IMPRESSION: No fracture or dislocation is noted. Mild soft tissue swelling is seen over lateral malleolus. Electronically Signed   By: Lupita Raider M.D.   On: 05/31/2019 14:34    Procedures Procedures (including critical care time)  Medications Ordered in UC Medications - No data to display  Initial Impression / Assessment and Plan / UC Course  I have reviewed the triage vital signs and the nursing notes.  Pertinent labs & imaging results that were available during my care of the patient were reviewed by me and considered in my medical decision making (see chart for details).    Ankle films without acute findings. Foot is non tender and without red flag findings. Left lower leg concerning for cellulitis with antibiotics provided. Return precautions provided. Noted elevated BP. Encouraged establish with pcp for recheck and management. Patient verbalized understanding and agreeable to plan.  Ambulatory out of clinic without difficulty.    Final Clinical Impressions(s) / UC Diagnoses   Final diagnoses:  Cellulitis of left lower extremity  Contusion of left lower extremity, initial encounter     Discharge Instructions     It does appear that you have infection to the skin of your left lower leg.  Please complete course of antibiotics.  Tylenol as needed for pain.  Please follow up with a primary care provider for recheck and management of your blood pressure.  Any  worsening of pain, redness, fevers, or otherwise worsening please return to be evaluated    ED Prescriptions    Medication Sig Dispense Auth. Provider   doxycycline (VIBRAMYCIN) 100 MG capsule Take 1 capsule (100 mg total) by mouth 2 (two) times daily. 20 capsule Leonard Haber, NP     PDMP  not reviewed this encounter.   Zigmund Gottron, NP 05/31/19 2001

## 2019-05-31 NOTE — Discharge Instructions (Addendum)
It does appear that you have infection to the skin of your left lower leg.  Please complete course of antibiotics.  Tylenol as needed for pain.  Please follow up with a primary care provider for recheck and management of your blood pressure.  Any worsening of pain, redness, fevers, or otherwise worsening please return to be evaluated

## 2019-05-31 NOTE — ED Triage Notes (Signed)
Patient presents to Urgent Care with complaints of right hand and left leg pain since having difficulty cutting branches off a tree. Patient reports it hurt his right hand and left lower leg. Pt reports redness and swelling goes from mid-shin all the way to his toes, pt denies altered sensation to foot.

## 2019-07-15 DIAGNOSIS — Z9189 Other specified personal risk factors, not elsewhere classified: Secondary | ICD-10-CM | POA: Diagnosis not present

## 2019-07-15 DIAGNOSIS — Z20828 Contact with and (suspected) exposure to other viral communicable diseases: Secondary | ICD-10-CM | POA: Diagnosis not present

## 2019-07-15 DIAGNOSIS — Z03818 Encounter for observation for suspected exposure to other biological agents ruled out: Secondary | ICD-10-CM | POA: Diagnosis not present

## 2020-01-17 ENCOUNTER — Ambulatory Visit (HOSPITAL_COMMUNITY)
Admission: EM | Admit: 2020-01-17 | Discharge: 2020-01-17 | Disposition: A | Discharge status: Home / Self Care | Payer: PPO | Attending: Family Medicine | Admitting: Family Medicine

## 2020-01-17 ENCOUNTER — Encounter (HOSPITAL_COMMUNITY): Payer: Self-pay

## 2020-01-17 ENCOUNTER — Other Ambulatory Visit: Payer: Self-pay

## 2020-01-17 DIAGNOSIS — H6123 Impacted cerumen, bilateral: Secondary | ICD-10-CM

## 2020-01-17 DIAGNOSIS — R42 Dizziness and giddiness: Secondary | ICD-10-CM

## 2020-01-17 MED ORDER — MECLIZINE HCL 12.5 MG PO TABS: 12.5000 mg | ORAL_TABLET | Freq: Three times a day (TID) | ORAL | 0 refills | Status: DC | PRN

## 2020-01-17 NOTE — Discharge Instructions (Signed)
We have cleaned out the wax in your ears today  You may use a solution of half water half peroxide to keep your ears clear  If this is not working, I have also sent in meclizine to your pharmacy.  You may take this 3 times a day as needed for dizziness  Follow-up as needed

## 2020-01-17 NOTE — ED Provider Notes (Signed)
[facility]   [id number] 01/17/20 Arrival Time: 1749  CC: EAR PAIN  SUBJECTIVE: History from: patient.  Leonard Green is a 69 y.o. male who presents with of bilateral ear fullness for the last 3 days.  Also reports dizziness for the last 3 days.  Denies a precipitating event, such as swimming or wearing ear plugs. Patient states the pain is constant and achy in character.  Has not taken any OTC medications for this.  Reports that when he is having an episode of dizziness, feels like the room is spinning.  Symptoms are made worse with lying down. Denies fever, chills, fatigue, sinus pain, rhinorrhea, ear discharge, sore throat, SOB, wheezing, chest pain, nausea, changes in bowel or bladder habits.    ROS: As per HPI.  All other pertinent ROS negative.     Past Medical History:  Diagnosis Date  . Hypertension    Past Surgical History:  Procedure Laterality Date  . JOINT REPLACEMENT     hip   No Known Allergies No current facility-administered medications on file prior to encounter.   Current Outpatient Medications on File Prior to Encounter  Medication Sig Dispense Refill  . cyclobenzaprine (FLEXERIL) 5 MG tablet Take 1 tablet by mouth before bed as needed for muscle spasm. Warning: May cause drowsiness. 10 tablet 0  . doxycycline (VIBRAMYCIN) 100 MG capsule Take 1 capsule (100 mg total) by mouth 2 (two) times daily. 20 capsule 0  . ferrous sulfate 325 (65 FE) MG tablet Take 1 tablet (325 mg total) by mouth 3 (three) times daily after meals. 90 tablet 0   Social History   Socioeconomic History  . Marital status: Single    Spouse name: Not on file  . Number of children: Not on file  . Years of education: Not on file  . Highest education level: Not on file  Occupational History  . Not on file  Tobacco Use  . Smoking status: Never Smoker  . Smokeless tobacco: Never Used  Vaping Use  . Vaping Use: Never used  Substance and Sexual Activity  . Alcohol use: Never  .  Drug use: Never  . Sexual activity: Not on file  Other Topics Concern  . Not on file  Social History Narrative  . Not on file   Social Determinants of Health   Financial Resource Strain:   . Difficulty of Paying Living Expenses:   Food Insecurity:   . Worried About Programme researcher, broadcasting/film/video in the Last Year:   . Barista in the Last Year:   Transportation Needs:   . Freight forwarder (Medical):   Marland Kitchen Lack of Transportation (Non-Medical):   Physical Activity:   . Days of Exercise per Week:   . Minutes of Exercise per Session:   Stress:   . Feeling of Stress :   Social Connections:   . Frequency of Communication with Friends and Family:   . Frequency of Social Gatherings with Friends and Family:   . Attends Religious Services:   . Active Member of Clubs or Organizations:   . Attends Banker Meetings:   Marland Kitchen Marital Status:   Intimate Partner Violence:   . Fear of Current or Ex-Partner:   . Emotionally Abused:   Marland Kitchen Physically Abused:   . Sexually Abused:    Family History  Problem Relation Age of Onset  . Cancer Mother   . Asthma Father   . Cancer Father     OBJECTIVE:  Vitals:   01/17/20 1815  BP: (!) 174/82  Pulse: (!) 51  Resp: 17  Temp: 98.6 F (37 C)  TempSrc: Oral  SpO2: 100%     General appearance: alert; appears fatigued HEENT: Ears: Bilateral cerumen impaction, TMs pearly gray with visible cone of light, without erythema; Eyes: PERRL, EOMI grossly; Sinuses nontender to palpation; Nose: clear rhinorrhea; Throat: oropharynx mildly erythematous, tonsils 1+ without white tonsillar exudates, uvula midline Neck: supple without LAD Lungs: unlabored respirations, symmetrical air entry; cough: absent; no respiratory distress Heart: regular rate and rhythm.  Radial pulses 2+ symmetrical bilaterally Skin: warm and dry Psychological: alert and cooperative; normal mood and affect  Imaging: No results found.   ASSESSMENT & PLAN:  1. Dizziness    2. Bilateral impacted cerumen     Meds ordered this encounter  Medications  . meclizine (ANTIVERT) 12.5 MG tablet    Sig: Take 1 tablet (12.5 mg total) by mouth 3 (three) times daily as needed for dizziness.    Dispense:  30 tablet    Refill:  0    Order Specific Question:   Supervising Provider    Answer:   Merrilee Jansky [[id number]]   Prescribed meclizine in case of vertigo If symptoms do not improve with irrigation alone, you may take meclizine 3 times daily as needed dizziness Rest and drink plenty of fluids Ear wash in office today Bilateral EACs clear after irrigation Take medications as directed and to completion Continue to use OTC ibuprofen and/ or tylenol as needed for pain control Follow up with PCP if symptoms persists Return here or go to the ER if you have any new or worsening symptoms   Reviewed expectations re: course of current medical issues. Questions answered. Outlined signs and symptoms indicating need for more acute intervention. Patient verbalized understanding. After Visit Summary given.          Moshe Cipro, NP 01/17/20 1937

## 2020-01-17 NOTE — ED Triage Notes (Signed)
Pt reports he feels the room is "spinning" on and off for the past 2 days. Worsens when moving the head to the side or bend over. Denies headache, nausea.

## 2021-06-14 ENCOUNTER — Ambulatory Visit (HOSPITAL_COMMUNITY)
Admission: EM | Admit: 2021-06-14 | Discharge: 2021-06-14 | Disposition: A | Discharge status: Home / Self Care | Payer: Medicare Other | Attending: Internal Medicine | Admitting: Internal Medicine

## 2021-06-14 ENCOUNTER — Encounter (HOSPITAL_COMMUNITY): Payer: Self-pay | Admitting: Emergency Medicine

## 2021-06-14 ENCOUNTER — Other Ambulatory Visit: Payer: Self-pay

## 2021-06-14 DIAGNOSIS — H6192 Disorder of left external ear, unspecified: Secondary | ICD-10-CM

## 2021-06-14 DIAGNOSIS — B9689 Other specified bacterial agents as the cause of diseases classified elsewhere: Secondary | ICD-10-CM

## 2021-06-14 DIAGNOSIS — H109 Unspecified conjunctivitis: Secondary | ICD-10-CM | POA: Diagnosis not present

## 2021-06-14 MED ORDER — OLOPATADINE HCL 0.1 % OP SOLN: 1.0000 [drp] | Freq: Two times a day (BID) | OPHTHALMIC | 12 refills | Status: AC

## 2021-06-14 MED ORDER — ERYTHROMYCIN 5 MG/GM OP OINT: TOPICAL_OINTMENT | Freq: Two times a day (BID) | OPHTHALMIC | 0 refills | Status: AC

## 2021-06-14 NOTE — ED Triage Notes (Signed)
Pt reports was racking leaves on MOnday and reports that later that night pinkish-red in color having drainage and itching.  Pt also wants left ear looked at that has been having a sore on it for 5 years. Pt doesn't have a PCP.

## 2021-06-14 NOTE — ED Provider Notes (Signed)
[facility]    CSN: [id number] Arrival date & time: 06/14/21  0945      History   Chief Complaint Chief Complaint  Patient presents with   Conjunctivitis    HPI Leonard Green is a 70 y.o. male comes to the urgent care with 3 to 4 days history of itchy watery eyes.  Patient's symptoms started insidiously and has been persistent.  He raked leaves on Monday prior to onset of the symptoms.  He has been rubbing his eyes.  He denies any light sensitivity.  He endorses crusting of his eyelids.  No fever or chills.  No sore throat, cough or sputum production.  No double vision or blurry vision.  Patient complains of skin lesion on the left ear of 5 years duration.  Lesion is nonhealing and bleeds frequently with minimal trauma.  Patient has not been evaluated in the past.   HPI  Past Medical History:  Diagnosis Date   Hypertension     Patient Active Problem List   Diagnosis Date Noted   Fracture of femoral neck, left ([place]) 05/21/2011    Past Surgical History:  Procedure Laterality Date   JOINT REPLACEMENT     hip       Home Medications    Prior to Admission medications   Medication Sig Start Date End Date Taking? Authorizing Provider  erythromycin ophthalmic ointment Place into both eyes in the morning and at bedtime for 5 days. Place a 1/2 inch ribbon of ointment into the lower eyelid. 06/14/21 06/19/21 Yes Kaelan Emami, Myrene Galas, MD  olopatadine (PATANOL) 0.1 % ophthalmic solution Place 1 drop into both eyes 2 (two) times daily for 10 days. 06/14/21 06/24/21 Yes Johne Buckle, Myrene Galas, MD    Family History Family History  Problem Relation Age of Onset   Cancer Mother    Asthma Father    Cancer Father     Social History Social History   Tobacco Use   Smoking status: Never   Smokeless tobacco: Never  Vaping Use   Vaping Use: Never used  Substance Use Topics   Alcohol use: Never   Drug use: Never     Allergies   Patient has no known allergies.   Review  of Systems Review of Systems  HENT: Negative.    Eyes:  Positive for discharge, redness and itching. Negative for photophobia, pain and visual disturbance.  Skin:  Positive for wound. Negative for color change and pallor.    Physical Exam Triage Vital Signs ED Triage Vitals  Enc Vitals Group     BP 06/14/21 1116 (!) 166/93     Pulse Rate 06/14/21 1116 65     Resp 06/14/21 1116 18     Temp 06/14/21 1116 98.9 F (37.2 C)     Temp Source 06/14/21 1116 Oral     SpO2 06/14/21 1116 98 %     Weight --      Height --      Head Circumference --      Peak Flow --      Pain Score 06/14/21 1114 5     Pain Loc --      Pain Edu? --      Excl. in [place]? --    No data found.  Updated Vital Signs BP (!) 166/93 (BP Location: Right Arm)   Pulse 65   Temp 98.9 F (37.2 C) (Oral)   Resp 18   SpO2 98%   Visual Acuity Right Eye Distance:  Left Eye Distance:   Bilateral Distance:    Right Eye Near:   Left Eye Near:    Bilateral Near:     Physical Exam Vitals and nursing note reviewed.  Constitutional:      General: He is not in acute distress.    Appearance: He is not ill-appearing.  HENT:     Right Ear: Tympanic membrane normal.     Left Ear: Tympanic membrane normal.  Eyes:     Pupils: Pupils are equal, round, and reactive to light.     Comments: Bilateral conjunctival erythema with purulent discharge.  Extraocular movements intact.    Cardiovascular:     Rate and Rhythm: Normal rate and regular rhythm.  Neurological:     Mental Status: He is alert.     UC Treatments / Results  Labs (all labs ordered are listed, but only abnormal results are displayed) Labs Reviewed - No data to display  EKG   Radiology No results found.  Procedures Procedures (including critical care time)  Medications Ordered in UC Medications - No data to display  Initial Impression / Assessment and Plan / UC Course  I have reviewed the triage vital signs and the nursing  notes.  Pertinent labs & imaging results that were available during my care of the patient were reviewed by me and considered in my medical decision making (see chart for details).     1.  Acute bacterial conjunctivitis of both eyes: Erythromycin ointment twice daily for 5 days Patanol eyedrops as needed for itching Return to urgent care if you have blurry vision, double vision or light sensitivity.  2.  Skin lesion on the left ear: [facility] dermatology follow-up recommended Usually rebiopsy of the lesion given the duration of the lesion. Final Clinical Impressions(s) / UC Diagnoses   Final diagnoses:  Bacterial conjunctivitis of both eyes  Skin lesion of left external ear     Discharge Instructions      Please use medications as prescribed If symptoms worsen please return to urgent care to be reevaluated Please make an appointment with a dermatologist to have the skin lesion on the left ear evaluated.  You will need a biopsy of that lesion on the ear.    ED Prescriptions     Medication Sig Dispense Auth. Provider   erythromycin ophthalmic ointment Place into both eyes in the morning and at bedtime for 5 days. Place a 1/2 inch ribbon of ointment into the lower eyelid. 3.5 g Seynabou Fults, Britta Mccreedy, MD   olopatadine (PATANOL) 0.1 % ophthalmic solution Place 1 drop into both eyes 2 (two) times daily for 10 days. 5 mL Aniket Paye, Britta Mccreedy, MD      PDMP not reviewed this encounter.   Merrilee Jansky, MD 06/14/21 [phone]

## 2021-06-14 NOTE — Discharge Instructions (Addendum)
Please use medications as prescribed If symptoms worsen please return to urgent care to be reevaluated Please make an appointment with a dermatologist to have the skin lesion on the left ear evaluated.  You will need a biopsy of that lesion on the ear.

## 2021-12-27 ENCOUNTER — Emergency Department (HOSPITAL_COMMUNITY): Payer: Medicare Other

## 2021-12-27 ENCOUNTER — Emergency Department (HOSPITAL_COMMUNITY)
Admission: EM | Admit: 2021-12-27 | Discharge: 2021-12-27 | Disposition: A | Discharge status: Home / Self Care | Payer: Medicare Other | Attending: Emergency Medicine | Admitting: Emergency Medicine

## 2021-12-27 ENCOUNTER — Encounter (HOSPITAL_COMMUNITY): Payer: Self-pay

## 2021-12-27 ENCOUNTER — Other Ambulatory Visit: Payer: Self-pay

## 2021-12-27 DIAGNOSIS — R112 Nausea with vomiting, unspecified: Secondary | ICD-10-CM | POA: Insufficient documentation

## 2021-12-27 DIAGNOSIS — R29810 Facial weakness: Secondary | ICD-10-CM | POA: Diagnosis not present

## 2021-12-27 DIAGNOSIS — I10 Essential (primary) hypertension: Secondary | ICD-10-CM

## 2021-12-27 DIAGNOSIS — I161 Hypertensive emergency: Secondary | ICD-10-CM | POA: Diagnosis not present

## 2021-12-27 DIAGNOSIS — B91 Sequelae of poliomyelitis: Secondary | ICD-10-CM | POA: Insufficient documentation

## 2021-12-27 DIAGNOSIS — R2681 Unsteadiness on feet: Secondary | ICD-10-CM

## 2021-12-27 DIAGNOSIS — H81312 Aural vertigo, left ear: Secondary | ICD-10-CM

## 2021-12-27 DIAGNOSIS — R42 Dizziness and giddiness: Secondary | ICD-10-CM | POA: Diagnosis not present

## 2021-12-27 DIAGNOSIS — R9431 Abnormal electrocardiogram [ECG] [EKG]: Secondary | ICD-10-CM | POA: Diagnosis not present

## 2021-12-27 DIAGNOSIS — E876 Hypokalemia: Secondary | ICD-10-CM

## 2021-12-27 DIAGNOSIS — R1112 Projectile vomiting: Secondary | ICD-10-CM

## 2021-12-27 DIAGNOSIS — Z85820 Personal history of malignant melanoma of skin: Secondary | ICD-10-CM | POA: Insufficient documentation

## 2021-12-27 LAB — DIFFERENTIAL
Abs Immature Granulocytes: 0.03 10*3/uL (ref 0.00–0.07)
Basophils Absolute: 0.1 10*3/uL (ref 0.0–0.1)
Basophils Relative: 1 %
Eosinophils Absolute: 0.1 10*3/uL (ref 0.0–0.5)
Eosinophils Relative: 1 %
Immature Granulocytes: 0 %
Lymphocytes Relative: 21 %
Lymphs Abs: 1.7 10*3/uL (ref 0.7–4.0)
Monocytes Absolute: 0.6 10*3/uL (ref 0.1–1.0)
Monocytes Relative: 7 %
Neutro Abs: 6 10*3/uL (ref 1.7–7.7)
Neutrophils Relative %: 70 %

## 2021-12-27 LAB — COMPREHENSIVE METABOLIC PANEL
ALT: 11 U/L (ref 0–44)
AST: 18 U/L (ref 15–41)
Albumin: 3.7 g/dL (ref 3.5–5.0)
Alkaline Phosphatase: 70 U/L (ref 38–126)
Anion gap: 15 (ref 5–15)
BUN: 17 mg/dL (ref 8–23)
CO2: 20 mmol/L — ABNORMAL LOW (ref 22–32)
Calcium: 8.9 mg/dL (ref 8.9–10.3)
Chloride: 104 mmol/L (ref 98–111)
Creatinine, Ser: 1.18 mg/dL (ref 0.61–1.24)
GFR, Estimated: >60 mL/min (ref >60)
Glucose, Bld: 154 mg/dL — ABNORMAL HIGH (ref 70–99)
Potassium: 3.3 mmol/L — ABNORMAL LOW (ref 3.5–5.1)
Sodium: 139 mmol/L (ref 135–145)
Total Bilirubin: 0.6 mg/dL (ref 0.3–1.2)
Total Protein: 6.8 g/dL (ref 6.5–8.1)

## 2021-12-27 LAB — MAGNESIUM: Magnesium: 2 mg/dL (ref 1.7–2.4)

## 2021-12-27 LAB — I-STAT CHEM 8, ED
BUN: 16 mg/dL (ref 8–23)
Calcium, Ion: 1.06 mmol/L — ABNORMAL LOW (ref 1.15–1.40)
Chloride: 105 mmol/L (ref 98–111)
Creatinine, Ser: 1.1 mg/dL (ref 0.61–1.24)
Glucose, Bld: 156 mg/dL — ABNORMAL HIGH (ref 70–99)
HCT: 41 % (ref 39.0–52.0)
Hemoglobin: 13.9 g/dL (ref 13.0–17.0)
Potassium: 3.3 mmol/L — ABNORMAL LOW (ref 3.5–5.1)
Sodium: 138 mmol/L (ref 135–145)
TCO2: 20 mmol/L — ABNORMAL LOW (ref 22–32)

## 2021-12-27 LAB — CBC
HCT: 40.5 % (ref 39.0–52.0)
Hemoglobin: 14 g/dL (ref 13.0–17.0)
MCH: 31 pg (ref 26.0–34.0)
MCHC: 34.6 g/dL (ref 30.0–36.0)
MCV: 89.8 fL (ref 80.0–100.0)
Platelets: 176 10*3/uL (ref 150–400)
RBC: 4.51 MIL/uL (ref 4.22–5.81)
RDW: 13.3 % (ref 11.5–15.5)
WBC: 8.5 10*3/uL (ref 4.0–10.5)
nRBC: 0 % (ref 0.0–0.2)

## 2021-12-27 LAB — ETHANOL: Alcohol, Ethyl (B): <10 mg/dL (ref <10)

## 2021-12-27 LAB — PROTIME-INR
INR: 1 (ref 0.8–1.2)
Prothrombin Time: 12.8 seconds (ref 11.4–15.2)

## 2021-12-27 LAB — CBG MONITORING, ED: Glucose-Capillary: 149 mg/dL — ABNORMAL HIGH (ref 70–99)

## 2021-12-27 LAB — APTT: aPTT: 26 seconds (ref 24–36)

## 2021-12-27 MED ORDER — MECLIZINE HCL 25 MG PO TABS: 25.0000 mg | ORAL_TABLET | Freq: Once | ORAL | Status: DC

## 2021-12-27 MED ORDER — POTASSIUM CHLORIDE CRYS ER 20 MEQ PO TBCR
40.0000 meq | EXTENDED_RELEASE_TABLET | Freq: Once | ORAL | Status: DC
  Filled 2021-12-27: qty 2

## 2021-12-27 MED ORDER — MECLIZINE HCL 25 MG PO TABS: 25.0000 mg | ORAL_TABLET | Freq: Three times a day (TID) | ORAL | 0 refills | Status: DC | PRN

## 2021-12-27 MED ORDER — GADOBUTROL 1 MMOL/ML IV SOLN
6.0000 mL | Freq: Once | INTRAVENOUS | Status: AC | PRN
  Administered 2021-12-27: 6 mL via INTRAVENOUS

## 2021-12-27 MED ORDER — HYDRALAZINE HCL 20 MG/ML IJ SOLN
10.0000 mg | INTRAMUSCULAR | Status: DC | PRN
  Administered 2021-12-27: 10 mg via INTRAVENOUS
  Filled 2021-12-27: qty 1

## 2021-12-27 MED ORDER — ASPIRIN 325 MG PO TABS
325.0000 mg | ORAL_TABLET | Freq: Once | ORAL | Status: AC
  Administered 2021-12-27: 325 mg via ORAL
  Filled 2021-12-27: qty 1

## 2021-12-27 MED ORDER — MECLIZINE HCL 25 MG PO TABS
25.0000 mg | ORAL_TABLET | Freq: Once | ORAL | Status: AC
  Administered 2021-12-27: 25 mg via ORAL
  Filled 2021-12-27: qty 1

## 2021-12-27 MED ORDER — POTASSIUM CHLORIDE 20 MEQ PO PACK
40.0000 meq | PACK | Freq: Two times a day (BID) | ORAL | Status: DC
  Administered 2021-12-27: 40 meq via ORAL
  Filled 2021-12-27: qty 2

## 2021-12-27 MED ORDER — LABETALOL HCL 5 MG/ML IV SOLN: 10.0000 mg | INTRAVENOUS | Status: DC | PRN

## 2021-12-27 MED ORDER — ASPIRIN 325 MG PO TABS
325.0000 mg | ORAL_TABLET | Freq: Every day | ORAL | Status: DC
Start: 2021-12-27 — End: 2021-12-27

## 2021-12-27 MED ORDER — SODIUM CHLORIDE 0.9% FLUSH
3.0000 mL | Freq: Once | INTRAVENOUS | Status: AC
  Administered 2021-12-27: 3 mL via INTRAVENOUS

## 2021-12-27 NOTE — ED Notes (Signed)
Code stroke activated per RN Megans request, spoke with Cala Bradford

## 2021-12-27 NOTE — ED Provider Notes (Signed)
Pt signed out by previous provider.  Pt is feeling much better and no longer feels dizzy.   MRI:  IMPRESSION:  1. Mildly motion degraded exam.  2. No evidence of acute intracranial abnormality.  3. No evidence of intracranial metastatic disease.  4. Prominent perivascular space versus chronic lacunar infarct  within the left basal ganglia  5. 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  6. Paranasal sinus disease, as described.  7. Bilateral mastoid effusions.  8. Incompletely assessed cervical spondylosis. At C3-C4, C4-C5 and  C5-C6, there are posterior disc osteophytes contributing to at least  moderate spinal canal stenosis. Consider a dedicated cervical spine  MRI for further evaluation.    Films were reviewed by me.  I agree with the radiologist.  Pt has HTN, but no pcp.  He is encouraged to establish care with pcp.  Pt is stable for d/c.  He is to return if worse.     Jacalyn Lefevre, MD 12/27/21 [phone]

## 2021-12-27 NOTE — Code Documentation (Signed)
Stroke Response Nurse Documentation Code Documentation  Leonard Green is a 71 y.o. male arriving to [facility]  via [facility] on 12/27/21 with past medical hx of HTN, surgery for CA removal one month prior. On No antithrombotic. Code stroke was activated by ED.   Patient from home when suddenly at 1200 became dizzy with projectile vomiting. Hypertensive with EMS 190/90; while in triage 180/110, code stroke called.   Stroke team at the bedside on patient activation in CT.  Patient to CT with team. NIHSS 1, see documentation for details and code stroke times. Patient with left gaze preference  on exam.   The following imaging was completed:  CT Head. Patient is not a candidate for IV Thrombolytic due to stroke not suspected. Patient is not a candidate for IR due to stroke not suspected.   Care Plan: cancel code stroke, SBP <180.   Bedside handoff with ED RN Pattricia Boss.    Scarlette Slice K  Stroke Response RN

## 2021-12-27 NOTE — Consult Note (Addendum)
Neurology Consult H&P  Leonard Green MR# [id number] 12/27/2021   CC: Dizziness nausea vomiting  History is obtained from: Patient and chart.  HPI: Leonard Green is a 71 y.o. male PMHx as reviewed below developed acute onset dizziness with projectile vomiting at 1200 today.  Presented to the ED, found to have systolic in the 190s and code stroke was activated.   LKW: 1200 tNK given: No to mild pretreatment IR Thrombectomy No, not indicated Modified Rankin Scale: 0-Completely asymptomatic and back to baseline post- stroke NIHSS: 0  ROS: A complete ROS was performed and is negative except as noted in the HPI.   Past Medical History:  Diagnosis Date   Hypertension      Family History  Problem Relation Age of Onset   Cancer Mother    Asthma Father    Cancer Father     Social History:  reports that he has never smoked. He has never used smokeless tobacco. He reports that he does not drink alcohol and does not use drugs.   Prior to Admission medications   Medication Sig Start Date End Date Taking? Authorizing Provider  terbinafine (LAMISIL) 250 MG tablet Take 250 mg by mouth daily. 12/05/21  Yes [provider]    Exam: Current vital signs: BP (!) 195/89   Pulse (!) 48   Temp (!) 97.5 F (36.4 C) (Oral)   Resp 13   SpO2 99%   Physical Exam  Constitutional: Appears well-developed and well-nourished.  Psych: Affect appropriate to situation Eyes: No scleral injection HENT: No OP obstruction. Head: Normocephalic.  Cardiovascular: Normal rate and regular rhythm.  Respiratory: Effort normal, symmetric excursions bilaterally, no audible wheezing. GI: Soft.  No distension. There is no tenderness.  Skin: WDI  Neuro: Mental Status: Patient is awake, alert, oriented to person, place, month, year, and situation. Patient is able to give a clear and coherent history. Speech fluent, intact comprehension and repetition. No signs of aphasia or neglect. Visual Fields  are full. Pupils are equal, round, and reactive to light. Rare brief disconjugate gaze of right eye when looking to the left which promptly corrects.    Patient was made to set up and became actively dizzy. HINTS Left beating nystagmus with clockwise rotatory component with fatigue. Head impulse test abnormal. No skew deviation.   Facial sensation is symmetric to temperature Facial movement is symmetric.  Hearing is intact to voice. Uvula midline and palate elevates symmetrically. Shoulder shrug is symmetric. Tongue is midline without atrophy or fasciculations.  Tone is normal. Bulk is normal. 5/5 strength was present in all four extremities. Sensation is symmetric to light touch and temperature in the arms and legs. Deep Tendon Reflexes: 2+ and symmetric in the biceps and patellae. Toes are downgoing bilaterally. FNF and HKS are intact bilaterally. Gait -unsteady in multiple directions.  I have reviewed labs in epic and the pertinent results are: CBG 149  I have reviewed the images obtained: NCT head showed no evidence of acute intracranial abnormality. ASPECTS is 10.  Assessment: Leonard Green is a 71 y.o. male PMHx as noted above, childhood polio residual left-sided deficits with undiagnosed hypertension with hypertensive emergency and vertigo with associated nausea and vomiting.  Patient noted he had left ear surgery 1 month ago for melanoma.  Patient was made to set up and became actively dizzy. HINTS: Left beating nystagmus with clockwise rotatory component with fatigue. Head impulse test abnormal. No skew deviation.  Hints exam is reassuring however, he will  need admission for hypertensive emergency and better control of his hypertension.  Of note: It is not uncommon for patients with hypertensive emergency to have small acute punctate infarctions.  However at this point symptoms are too mild to treat with thrombolytic therapy.  Recommended aspirin 324mg   now.  Impression:  Hypertensive emergency Vertigo - likely peripheral localizing to the left ear Uncontrolled hypertension S/P left ear melanoma resection  Plan: STAT chest X-ray. Gradually reduce MAP: ~10-20% the first hour and by 5-15% over the next 23 hours: Target blood pressure of <180/<179mmHg for the first hour. Then <160/<113mmHg for the next 23 hours. Hydralazine 10 mg as needed to maintain target blood pressure.  - MRI brain with and without contrast and if positive please call neurology and continue stroke work-up: - Recommend vascular imaging with MRA head and neck. - Recommend TTE. - Recommend labs: HbA1c, lipid panel. - Recommend Statin for goal LDL <70. - Goal A1c <7. - Aspirin 81mg  daily. - Clopidogrel 75mg  daily for 3 weeks. - Telemetry monitoring for arrhythmia. - Recommend bedside Swallow screen. - Recommend Stroke education. - Recommend PT/OT/SLP consult.   - Neurology will remain available, please call for questions.   This patient is critically ill and at significant risk of neurological worsening, death and care requires constant monitoring of vital signs, hemodynamics,respiratory and cardiac monitoring, neurological assessment, discussion with family, other specialists and medical decision making of high complexity. I spent 107 minutes of neurocritical care time  in the care of  this patient. This was time spent independent of any time provided by nurse practitioner or PA.  Electronically signed by:  9m, MD Page: 12/27/2021, 2:26 PM  If 7pm- 7am, please page neurology on call as listed in AMION.

## 2021-12-27 NOTE — ED Provider Notes (Signed)
[facility] EMERGENCY DEPARTMENT Provider Note   CSN: [id number] Arrival date & time: 12/27/21  1329     History  Chief Complaint  Patient presents with   Emesis   Dizziness    Leonard Green is a 71 y.o. male.  71 year old male brought in by EMS with complaint of sudden onset of dizziness described as room spinning with vomiting.  Found to be hypertensive with EMS with blood pressure of 190/100, no history of hypertension.  Patient states he had a similar episode of dizziness 2 years ago in July when he had vertigo and states this feels similar.  He feels like his balance is off when he tries to walk.  He denies unilateral weakness or numbness.  He states that his face is asymmetrical due to having polio as a child, denies any limb weakness as a result.       Home Medications Prior to Admission medications   Medication Sig Start Date End Date Taking? Authorizing Provider  terbinafine (LAMISIL) 250 MG tablet Take 250 mg by mouth daily. 12/05/21  Yes [provider]      Allergies    Patient has no known allergies.    Review of Systems   Review of Systems Negative except as per HPI Physical Exam Updated Vital Signs BP (!) 185/90   Pulse (!) 57   Temp (!) 97.5 F (36.4 C) (Oral)   Resp (!) 21   SpO2 100%  Physical Exam Vitals and nursing note reviewed.  Constitutional:      General: He is not in acute distress.    Appearance: He is well-developed. He is not diaphoretic.  HENT:     Head: Normocephalic and atraumatic.     Nose: Nose normal.     Mouth/Throat:     Mouth: Mucous membranes are moist.  Eyes:     General: No visual field deficit.    Extraocular Movements: Extraocular movements intact.     Pupils: Pupils are equal, round, and reactive to light.  Cardiovascular:     Rate and Rhythm: Normal rate and regular rhythm.     Pulses: Normal pulses.     Heart sounds: Normal heart sounds.  Pulmonary:     Effort: Pulmonary effort is  normal.     Breath sounds: Normal breath sounds.  Abdominal:     Palpations: Abdomen is soft.     Tenderness: There is no abdominal tenderness.  Musculoskeletal:     Cervical back: Neck supple.  Skin:    General: Skin is warm and dry.     Findings: No erythema or rash.  Neurological:     Mental Status: He is alert and oriented to person, place, and time.     GCS: GCS eye subscore is 4. GCS verbal subscore is 5. GCS motor subscore is 6.     Cranial Nerves: Facial asymmetry present.     Sensory: No sensory deficit.     Motor: No weakness or pronator drift.     Coordination: Finger-Nose-Finger Test normal.     Gait: Gait abnormal.     Comments: Facial asymmetry, patient notes this is from having polio as a child.  No facial weakness. Ambulatory independently but unsteady.   Psychiatric:        Behavior: Behavior normal.     ED Results / Procedures / Treatments   Labs (all labs ordered are listed, but only abnormal results are displayed) Labs Reviewed  COMPREHENSIVE METABOLIC PANEL -  Abnormal; Notable for the following components:      Result Value   Potassium 3.3 (*)    CO2 20 (*)    Glucose, Bld 154 (*)    All other components within normal limits  I-STAT CHEM 8, ED - Abnormal; Notable for the following components:   Potassium 3.3 (*)    Glucose, Bld 156 (*)    Calcium, Ion 1.06 (*)    TCO2 20 (*)    All other components within normal limits  CBG MONITORING, ED - Abnormal; Notable for the following components:   Glucose-Capillary 149 (*)    All other components within normal limits  PROTIME-INR  APTT  CBC  DIFFERENTIAL  ETHANOL    EKG EKG Interpretation  Date/Time:  Thursday December 27 2021 13:59:16 EDT Ventricular Rate:  54 PR Interval:  155 QRS Duration: 98 QT Interval:  525 QTC Calculation: 498 R Axis:   78 Text Interpretation: Sinus rhythm Atrial premature complexes RSR' in V1 or V2, right VCD or RVH Minimal ST elevation, anterior leads Borderline  prolonged QT interval no change from first previous 20 minutes earlier Confirmed by Arby Barrette [id number]) on 12/27/2021 4:04:38 PM  Radiology CT HEAD CODE STROKE WO CONTRAST  Result Date: 12/27/2021 CLINICAL DATA:  Code stroke.  Neuro deficit, acute, stroke suspected EXAM: CT HEAD WITHOUT CONTRAST TECHNIQUE: Contiguous axial images were obtained from the base of the skull through the vertex without intravenous contrast. RADIATION DOSE REDUCTION: This exam was performed according to the departmental dose-optimization program which includes automated exposure control, adjustment of the mA and/or kV according to patient size and/or use of iterative reconstruction technique. COMPARISON:  CT head 08/06/2007. FINDINGS: Brain: No evidence of acute infarction, hemorrhage, hydrocephalus, extra-axial collection or mass lesion/mass effect. Vascular: No hyperdense vessel identified. Skull: No acute fracture. Sinuses/Orbits: Inferior maxillary sinus paranasal sinus mucosal thickening. Other: Right larger than left mastoid effusions. ASPECTS [facility] Stroke Program Early CT Score) total score (0-10 with 10 being normal): 10. IMPRESSION: 1. No evidence of acute intracranial abnormality. ASPECTS is 10. 2. Chronic right mastoid effusion. Code stroke imaging results were communicated on 12/27/2021 at 1:55 pm to provider [facility] via secure text paging. Electronically Signed   By: Feliberto Harts M.D.   On: 12/27/2021 13:56    Procedures Procedures    Medications Ordered in ED Medications  hydrALAZINE (APRESOLINE) injection 10 mg (10 mg Intravenous Given 12/27/21 1441)  potassium chloride (KLOR-CON) packet 40 mEq (has no administration in time range)  sodium chloride flush (NS) 0.9 % injection 3 mL (3 mLs Intravenous Given 12/27/21 1442)  meclizine (ANTIVERT) tablet 25 mg (25 mg Oral Given 12/27/21 1441)  aspirin tablet 325 mg (325 mg Oral Given 12/27/21 1441)    ED Course/ Medical Decision Making/ A&P                            Medical Decision Making Amount and/or Complexity of Data Reviewed Labs: ordered. Radiology: ordered.  Risk Prescription drug management.   This patient presents to the ED for concern of sudden onset dizziness/room spinning with vomiting, this involves an extensive number of treatment options, and is a complaint that carries with it a high risk of complications and morbidity.  The differential diagnosis includes but not limited to vertigo,   Co morbidities that complicate the patient evaluation  Hypertension, polio, recent left ear surgery   Additional history obtained:  External records from outside source obtained and reviewed  including visit to urgent care on 11/19/2019 with complaint of bilateral ear fullness x3 days with dizziness for 3 days   Lab Tests:  I Ordered, and personally interpreted labs.  The pertinent results include: CMP with mild hypokalemia at 3.3.  CBC is unremarkable.  INR normal.   Imaging Studies ordered:  I ordered imaging studies including CT head, MRI brain I independently visualized and interpreted imaging which showed patient CT head is negative for acute abnormality, MRI brain pending at time of signout. I agree with the radiologist interpretation   Cardiac Monitoring: / EKG:  The patient was maintained on a cardiac monitor.  I personally viewed and interpreted the cardiac monitored which showed an underlying rhythm of: sinus rhythm, prolong QT   Consultations Obtained:  I requested consultation with the neurology, Dr. Theda Sers,  and discussed lab and imaging findings as well as pertinent plan - they recommend: Cancel code stroke, likely vertigo, agrees with plan for MRI brain   Problem List / ED Course / Critical interventions / Medication management  71 year old male presents with sudden onset of dizziness described as room spinning with nausea and vomiting.  He reports this to be similar to when he had vertigo 2 years ago.  He  has recently had left ear surgery.  He is found to have facial asymmetry which she states is baseline from polio as a child.  He has sensation intact with equal grip and leg strength.  His gait is unsteady, he feels off balance.  Patient was seen by neurology, has canceled code stroke, feels patient is appropriate for MRI brain.  Patient is hypertensive, but he does not carry a diagnosis of hypertension, his prior pressures on file have all been elevated, will monitor. Care signed out at time of shift pending MRI brain, repeat EKG (prolong QT) Patient was given potassium for his mild hypokalemia as well as Antivert for his dizziness. I have reviewed the patients home medicines and have made adjustments as needed   Social Determinants of Health:  No PCP   Test / Admission - Considered:  MRI brain pending, may require admission pending results.         Final Clinical Impression(s) / ED Diagnoses Final diagnoses:  Dizziness  Nausea and vomiting, unspecified vomiting type  Prolonged Q-T interval on ECG    Rx / DC Orders ED Discharge Orders     None         Tacy Learn, PA-C 12/27/21 1625    Charlesetta Shanks, MD 01/11/22 1137

## 2021-12-27 NOTE — ED Notes (Signed)
Code Stroke cancelled per Dr. Thomasena Edis, spoke with Festus Barren

## 2021-12-27 NOTE — ED Triage Notes (Signed)
Patient had sudden onset dizziness and 12pm today with n/v.  Patient had BP 190/100 with no HTN of hypertension also EKG revealed prolonged QT per EMS.

## 2021-12-27 NOTE — Discharge Instructions (Addendum)
You need to establish care with a primary care doctor. 

## 2021-12-27 NOTE — ED Provider Triage Note (Signed)
Emergency Medicine Provider Triage Evaluation Note  DEMETRY BENDICKSON , a 71 y.o. male  was evaluated in triage.  Pt complains of dizziness and vomiting.    Review of Systems  Positive: dizziness Negative: Fever   Physical Exam  There were no vitals taken for this visit. Gen:   Awake, vomiting Resp:  Normal effort  MSK:   Moves extremities without difficulty  Other:    Medical Decision Making  Medically screening exam initiated at 1:34 PM.  Appropriate orders placed.  CHAZZ PHILSON was informed that the remainder of the evaluation will be completed by another provider, this initial triage assessment does not replace that evaluation, and the importance of remaining in the ED until their evaluation is complete.  Stroke orders by Claudean Kinds, PA-C 12/27/21 1336

## 2022-01-14 NOTE — Progress Notes (Signed)
Subjective:    Leonard Green - 71 y.o. male MRN [id number]  Date of birth: [birth year]  HPI  Leonard Green is to establish care and emergency department follow-up.  Current issues and/or concerns: Emergency department follow-up: 12/27/2021 [facility] Emergency Department per MD note: or Complexity of Data Reviewed Labs: ordered. Radiology: ordered.   Risk Prescription drug management.     This patient presents to the ED for concern of sudden onset dizziness/room spinning with vomiting, this involves an extensive number of treatment options, and is a complaint that carries with it a high risk of complications and morbidity.  The differential diagnosis includes but not limited to vertigo,     Co morbidities that complicate the patient evaluation   Hypertension, polio, recent left ear surgery     Additional history obtained:   External records from outside source obtained and reviewed including visit to urgent care on 11/19/2019 with complaint of bilateral ear fullness x3 days with dizziness for 3 days     Lab Tests:   I Ordered, and personally interpreted labs.  The pertinent results include: CMP with mild hypokalemia at 3.3.  CBC is unremarkable.  INR normal.     Imaging Studies ordered:   I ordered imaging studies including CT head, MRI brain I independently visualized and interpreted imaging which showed patient CT head is negative for acute abnormality, MRI brain pending at time of signout. I agree with the radiologist interpretation     Cardiac Monitoring: / EKG:   The patient was maintained on a cardiac monitor.  I personally viewed and interpreted the cardiac monitored which showed an underlying rhythm of: sinus rhythm, prolong QT     Consultations Obtained:   I requested consultation with the neurology, Dr. Thomasena Edis,  and discussed lab and imaging findings as well as pertinent plan - they recommend: Cancel code stroke, likely vertigo, agrees with plan for  MRI brain     Problem List / ED Course / Critical interventions / Medication management   71 year old male presents with sudden onset of dizziness described as room spinning with nausea and vomiting.  He reports this to be similar to when he had vertigo 2 years ago.  He has recently had left ear surgery.  He is found to have facial asymmetry which she states is baseline from polio as a child.  He has sensation intact with equal grip and leg strength.  His gait is unsteady, he feels off balance.  Patient was seen by neurology, has canceled code stroke, feels patient is appropriate for MRI brain.  Patient is hypertensive, but he does not carry a diagnosis of hypertension, his prior pressures on file have all been elevated, will monitor. Care signed out at time of shift pending MRI brain, repeat EKG (prolong QT) Patient was given potassium for his mild hypokalemia as well as Antivert for his dizziness. I have reviewed the patients home medicines and have made adjustments as needed     Social Determinants of Health:   No PCP     Test / Admission - Considered:   MRI brain pending, may require admission pending results.    MRI:   IMPRESSION:  1. Mildly motion degraded exam.  2. No evidence of acute intracranial abnormality.  3. No evidence of intracranial metastatic disease.  4. Prominent perivascular space versus chronic lacunar infarct  within the left basal ganglia  5. 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  6. Paranasal sinus disease, as described.  7. Bilateral mastoid effusions.  8. Incompletely assessed cervical spondylosis. At C3-C4, C4-C5 and  C5-C6, there are posterior disc osteophytes contributing to at least  moderate spinal canal stenosis. Consider a dedicated cervical spine  MRI for further evaluation.     Films were reviewed by me.  I agree with the radiologist.   Pt has HTN, but no pcp.  He is encouraged to establish care with pcp.  Pt is  stable for d/c.  He is to return if worse.   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 Removed by ENT. Reports after stitches healed he was told to only follow-up as needed.  - History of vertigo. Reports did not have vertigo for 2 years until recently. Has not taken Meclizine in 10 days and feels well.  - No history of high blood pressure. States "I feel fine." Does not smoke. Does not eat salty foods. Drinks occasional coffee. Does not check blood pressures at home. Denies chest pain, shortness of breath and additional red flag symptoms.  - Reports notices trouble with turning neck while driving.    ROS per HPI     Health Maintenance:  Health Maintenance Due  Topic Date Due   COVID-19 Vaccine (1) Never done   Hepatitis C Screening  Never done   COLONOSCOPY (Pts 45-82yrs Insurance coverage will need to be confirmed)  Never done   Zoster Vaccines- Shingrix (1 of 2) Never done   Pneumonia Vaccine 32+ Years old (1 - PCV) Never done     Past Medical History: Patient Active Problem List   Diagnosis Date Noted   Fracture of femoral neck, left ([facility]) 05/21/2011      Social History   reports that he has never smoked. He has never been exposed to tobacco smoke. He has never used smokeless tobacco. He reports that he does not drink alcohol and does not use drugs.   Family History  family history includes Asthma in his father; Cancer in his father and mother.   Medications: reviewed and updated   Objective:   Physical Exam BP (!) 175/91 (BP Location: Left Arm, Patient Position: Sitting, Cuff Size: Normal)   Pulse 64   Temp 98.3 F (36.8 C)   Resp 16   Ht 5\' 5"  (1.651 m)   Wt 140 lb (63.5 kg)   SpO2 98%   BMI 23.30 kg/m   Physical Exam HENT:     Head: Normocephalic and atraumatic.  Eyes:     Extraocular Movements: Extraocular movements intact.     Conjunctiva/sclera: Conjunctivae normal.     Pupils: Pupils are equal, round, and reactive to light.   Cardiovascular:     Rate and Rhythm: Normal rate and regular rhythm.     Pulses: Normal pulses.     Heart sounds: Normal heart sounds.  Pulmonary:     Effort: Pulmonary effort is normal.     Breath sounds: Normal breath sounds.  Musculoskeletal:     Cervical back: Normal range of motion and neck supple.  Neurological:     General: No focal deficit present.     Mental Status: He is alert and oriented to person, place, and time.  Psychiatric:        Mood and Affect: Mood normal.        Behavior: Behavior normal.    Assessment & Plan:  1. Encounter to establish care - Patient presents today to establish care.  - Return for annual physical examination, labs, and  health maintenance. Arrive fasting meaning having no food for at least 8 hours prior to appointment. You may have only water or black coffee. Please take scheduled medications as normal.  2. Essential (primary) hypertension - Newly diagnosed. - Blood pressure not at goal during today's visit. Patient asymptomatic without chest pressure, chest pain, palpitations, shortness of breath, worst headache of life, and any additional red flag symptoms. - Begin low dose Valsartan as prescribed. Counseled on medication adherence and adverse effects.  - Counseled on blood pressure goal of less than 140/90, low-sodium, DASH diet, medication compliance, 150 minutes of moderate intensity exercise per week as tolerated. Discussed medication compliance, adverse effects. - Follow-up in 7 days with clinical pharmacist for blood pressure check. Write down your blood pressure readings each day and bring those results along with your home blood pressure monitor to your appointment. Medications may be adjusted at that time if needed. - Repeat BMP at appointment with clinical pharmacist and attach me to result.  - Referral to Cardiology for further evaluation and management.  - valsartan (DIOVAN) 40 MG tablet; Take 0.5 tablets (20 mg total) by mouth  daily.  Dispense: 15 tablet; Refill: 0 - Ambulatory referral to Cardiology - Basic Metabolic Panel; Future  3. QT prolongation - Referral to Cardiology for further evaluation and management.  - Ambulatory referral to Cardiology  4. Vertigo 5. Left ear pain 6. History of melanoma - Continue Meclizine as prescribed.  - Referral to ENT for further evaluation and management.  - meclizine (ANTIVERT) 25 MG tablet; Take 1 tablet (25 mg total) by mouth 3 (three) times daily as needed for dizziness.  Dispense: 30 tablet; Refill: 0 - Ambulatory referral to ENT  7. Spondylosis of cervical joint - Referral to Orthopedic Surgery for further evaluation and management. - Ambulatory referral to Orthopedic Surgery     Patient was given clear instructions to go to Emergency Department or return to medical center if symptoms don't improve, worsen, or new problems develop.The patient verbalized understanding.  I discussed the assessment and treatment plan with the patient. The patient was provided an opportunity to ask questions and all were answered. The patient agreed with the plan and demonstrated an understanding of the instructions.   The patient was advised to call back or seek an in-person evaluation if the symptoms worsen or if the condition fails to improve as anticipated.    Ricky Stabs, NP 01/21/2022, 10:49 AM [facility] at [facility]

## 2022-01-21 ENCOUNTER — Encounter: Payer: Self-pay | Admitting: Family

## 2022-01-21 ENCOUNTER — Ambulatory Visit (INDEPENDENT_AMBULATORY_CARE_PROVIDER_SITE_OTHER): Payer: Medicare Other | Admitting: Family

## 2022-01-21 VITALS — BP 175/91 | HR 64 | Temp 98.3°F | Resp 16 | Ht 65.0 in | Wt 140.0 lb

## 2022-01-21 DIAGNOSIS — I10 Essential (primary) hypertension: Secondary | ICD-10-CM | POA: Diagnosis not present

## 2022-01-21 DIAGNOSIS — M47812 Spondylosis without myelopathy or radiculopathy, cervical region: Secondary | ICD-10-CM

## 2022-01-21 DIAGNOSIS — R42 Dizziness and giddiness: Secondary | ICD-10-CM

## 2022-01-21 DIAGNOSIS — H9202 Otalgia, left ear: Secondary | ICD-10-CM

## 2022-01-21 DIAGNOSIS — R9431 Abnormal electrocardiogram [ECG] [EKG]: Secondary | ICD-10-CM | POA: Diagnosis not present

## 2022-01-21 DIAGNOSIS — Z85820 Personal history of malignant melanoma of skin: Secondary | ICD-10-CM

## 2022-01-21 DIAGNOSIS — Z7689 Persons encountering health services in other specified circumstances: Secondary | ICD-10-CM

## 2022-01-21 MED ORDER — MECLIZINE HCL 25 MG PO TABS: 25.0000 mg | ORAL_TABLET | Freq: Three times a day (TID) | ORAL | 0 refills | Status: DC | PRN

## 2022-01-21 MED ORDER — VALSARTAN 40 MG PO TABS: 20.0000 mg | ORAL_TABLET | Freq: Every day | ORAL | 0 refills | Status: DC

## 2022-01-21 NOTE — Progress Notes (Signed)
.  Pt presents to establish care,   

## 2022-01-30 ENCOUNTER — Encounter: Payer: Self-pay | Admitting: Surgery

## 2022-01-30 ENCOUNTER — Ambulatory Visit (INDEPENDENT_AMBULATORY_CARE_PROVIDER_SITE_OTHER): Payer: Medicare Other | Admitting: Surgery

## 2022-01-30 ENCOUNTER — Ambulatory Visit: Payer: Self-pay

## 2022-01-30 VITALS — BP 155/83 | HR 59 | Ht 65.0 in | Wt 140.0 lb

## 2022-01-30 DIAGNOSIS — M542 Cervicalgia: Secondary | ICD-10-CM

## 2022-01-30 DIAGNOSIS — M47812 Spondylosis without myelopathy or radiculopathy, cervical region: Secondary | ICD-10-CM

## 2022-01-30 NOTE — Progress Notes (Signed)
Office Visit Note   Patient: Leonard Green           Date of Birth: [birth year]           MRN: [id number] Visit Date: 01/30/2022              Requested by: Rema Fendt, NP [address] [place],  [place] [address] PCP: Rema Fendt, NP   Assessment & Plan: Visit Diagnoses:  1. Cervicalgia   2. Spondylosis without myelopathy or radiculopathy, cervical region     Plan: I will have patient follow-up with Dr. Ophelia Charter in a couple weeks for recheck to see how he is doing.  He will decide if further imaging studies are indicated of the cervical spine.   Follow-Up Instructions: Return in about 2 weeks (around 02/13/2022) for WITH DR YATES RECHECK NECK (PER Roberth Berling).   Orders:  Orders Placed This Encounter  Procedures   XR Cervical Spine 2 or 3 views   No orders of the defined types were placed in this encounter.     Procedures: No procedures performed   Clinical Data: No additional findings.   Subjective: Chief Complaint  Patient presents with   Neck - Pain    HPI 71 year old white male comes in today with complaints of neck stiffness.  He was seen in the emergency department December 27, 2021 due to his balance feeling off.  Treated for vertigo.  He was recently seen by his primary care provider who referred him to our office.  He had a MRI of the head December 27, 2021 and that study showed:  EXAM: MRI HEAD WITHOUT AND WITH CONTRAST   TECHNIQUE: Multiplanar, multiecho pulse sequences of the brain and surrounding structures were obtained without and with intravenous contrast.   CONTRAST:  33mL GADAVIST GADOBUTROL 1 MMOL/ML IV SOLN   COMPARISON:  Head CT 12/27/2021.   FINDINGS: Mild intermittent motion degradation.   Brain:   No age advanced or lobar predominant parenchymal atrophy.   Prominent perivascular space versus chronic lacunar infarct within the left basal ganglia (series 5, image 17) (series 9, image 17).   No cortical encephalomalacia is  identified. No significant cerebral white matter disease for age.   There is no acute infarct.   No evidence of an intracranial mass.   No chronic intracranial blood products.   No extra-axial fluid collection.   No midline shift.   No pathologic intracranial enhancement identified.   Vascular: Maintained flow voids within the proximal large arterial vessels. 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   Yes   Skull and upper cervical spine: No focal suspicious marrow lesion. Incompletely assessed cervical spondylosis. At C3-C4, C4-C5 and C5-C6, there are posterior disc osteophytes contributing to at least moderate spinal canal stenosis.   Sinuses/Orbits: No mass or acute finding within the imaged orbits. Prior bilateral ocular lens replacement. Mucous retention cysts within the right maxillary sinus, measuring up to 2.2 cm. Mild mucosal thickening within the left maxillary sinus. Mild mucosal thickening within the bilateral ethmoid sinuses.   Other: Bilateral mastoid effusions.   IMPRESSION: 1. Mildly motion degraded exam. 2. No evidence of acute intracranial abnormality. 3. No evidence of intracranial metastatic disease. 4. Prominent perivascular space versus chronic lacunar infarct within the left basal ganglia 5. 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 6. Paranasal sinus disease, as described. 7. Bilateral mastoid effusions. 8. Incompletely assessed cervical spondylosis. At C3-C4, C4-C5 and C5-C6, there are posterior disc osteophytes contributing to  at least moderate spinal canal stenosis. Consider a dedicated cervical spine MRI for further evaluation. Sign rib     Electronically Signed   By: Jackey Loge D.O.   On: 12/27/2021 17:56  States that it started from the turn his neck to the left while driving but he does not have any pain.  Denies any upper or lower extremity radicular symptoms. Review of Systems No  complaints of cardiopulmonary GI/GU issues  Objective: Vital Signs: BP (!) 155/83   Pulse (!) 59   Ht 5\' 5"  (1.651 m)   Wt 140 lb (63.5 kg)   BMI 23.30 kg/m   Physical Exam HENT:     Head: Normocephalic and atraumatic.  Pulmonary:     Effort: No respiratory distress.  Musculoskeletal:     Comments: Gait is normal.  No brachial plexus tenderness.  Bilateral shoulders unremarkable.  Patient has trace right triceps weakness.  All other strength testing within normal limits.  Neurological:     Mental Status: He is alert.     Ortho Exam  Specialty Comments:  No specialty comments available.  Imaging: No results found.   PMFS History: Patient Active Problem List   Diagnosis Date Noted   Fracture of femoral neck, left ([facility]) 05/21/2011   Past Medical History:  Diagnosis Date   Hypertension     Family History  Problem Relation Age of Onset   Cancer Mother    Asthma Father    Cancer Father     Past Surgical History:  Procedure Laterality Date   JOINT REPLACEMENT     hip   Social History   Occupational History   Not on file  Tobacco Use   Smoking status: Never    Passive exposure: Never   Smokeless tobacco: Never  Vaping Use   Vaping Use: Never used  Substance and Sexual Activity   Alcohol use: Never   Drug use: Never   Sexual activity: Not on file

## 2022-02-05 ENCOUNTER — Encounter: Payer: Self-pay | Admitting: Cardiovascular Disease

## 2022-02-05 ENCOUNTER — Ambulatory Visit (INDEPENDENT_AMBULATORY_CARE_PROVIDER_SITE_OTHER): Payer: Medicare Other | Admitting: Cardiovascular Disease

## 2022-02-05 VITALS — BP 154/85 | HR 58 | Ht 65.0 in | Wt 142.4 lb

## 2022-02-05 DIAGNOSIS — R9431 Abnormal electrocardiogram [ECG] [EKG]: Secondary | ICD-10-CM

## 2022-02-05 DIAGNOSIS — I10 Essential (primary) hypertension: Secondary | ICD-10-CM

## 2022-02-05 MED ORDER — VALSARTAN 80 MG PO TABS: 80.0000 mg | ORAL_TABLET | Freq: Every day | ORAL | 3 refills | Status: DC

## 2022-02-05 NOTE — Patient Instructions (Signed)
Medication Instructions:  INCREASE the Valsartan to 80 mg once daily  *If you need a refill on your cardiac medications before your next appointment, please call your pharmacy*   Lab Work: None ordered If you have labs (blood work) drawn today and your tests are completely normal, you will receive your results only by: MyChart Message (if you have MyChart) OR A paper copy in the mail If you have any lab test that is abnormal or we need to change your treatment, we will call you to review the results.   Testing/Procedures: None ordered   Follow-Up: At [facility], you and your health needs are our priority.  As part of our continuing mission to provide you with exceptional heart care, we have created designated Provider Care Teams.  These Care Teams include your primary Cardiologist (physician) and Advanced Practice Providers (APPs -  Physician Assistants and Nurse Practitioners) who all work together to provide you with the care you need, when you need it.  We recommend signing up for the patient portal called "MyChart".  Sign up information is provided on this After Visit Summary.  MyChart is used to connect with patients for Virtual Visits (Telemedicine).  Patients are able to view lab/test results, encounter notes, upcoming appointments, etc.  Non-urgent messages can be sent to your provider as well.   To learn more about what you can do with MyChart, go to [email or web address].    Your next appointment:   Follow up as needed with Dr. Royann Shivers  Blood pressure cuff has been given  Other Instructions Dr. Royann Shivers would like you to check your blood pressure daily for the next week.  Keep a journal of these daily blood pressure and heart rate readings and call our office or send a message through MyChart with the results. Thank you!  It is best to check your BP 1-2 hours after taking your medications to see the medications effectiveness on your BP.    Here are some tips that  our clinical pharmacists share for home BP monitoring:          Rest 10 minutes before taking your blood pressure.          Don't smoke or drink caffeinated beverages for at least 30 minutes before.          Take your blood pressure before (not after) you eat.          Sit comfortably with your back supported and both feet on the floor (don't cross your legs).          Elevate your arm to heart level on a table or a desk.          Use the proper sized cuff. It should fit smoothly and snugly around your bare upper arm. There should be enough room to slip a fingertip under the cuff. The bottom edge of the cuff should be 1 inch above the crease of the elbow.   Important Information About Sugar

## 2022-02-05 NOTE — Progress Notes (Signed)
Cardiology Office Note:    Date:  02/05/2022   ID:  Leonard Green, DOB 26-Jan-1951, MRN [id number]  PCP:  Rema Fendt, NP   [facility] Providers Cardiologist:  None     Referring MD: Rema Fendt, NP   Chief Complaint  Patient presents with   Abnormal ECG  Leonard Green is a 71 y.o. male who is being seen today for the evaluation of QT interval prolongation at the request of Rema Fendt, NP.   History of Present Illness:    Leonard Green is a 71 y.o. 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  He has hypertension that was not treated until recently, but does not have a history of coronary disease, congestive heart failure, valvular heart disease, diabetes mellitus, stroke/TIA or other serious chronic illnesses.  He has had hypertension "for years" but this has been mostly untreated.  He was just started on a very small dose of valsartan 20 mg once daily and his blood pressure remains high.  He is very lean and quite active for his age.  He was seen in the emergency room with vertigo (severe, clearly positional room was spinning, had nausea and some vomiting, lasted for 5 or 6 days). The ECG performed at that time showed sinus rhythm with PACs incomplete right bundle branch block pattern and a prolonged QT C around 500 ms.  Ionized calcium was low at 1.06 and potassium was 3.3.  On today's ECG the rhythm is sinus bradycardia 50 bpm and the QT interval is normal at 458 ms.  There are no repolarization abnormalities.  The complete right bundle branch block pattern is no longer seen.  The patient specifically denies any chest pain at rest or with exertion, dyspnea at rest or with exertion, orthopnea, paroxysmal nocturnal dyspnea, syncope, palpitations, focal neurological deficits, intermittent  claudication, lower extremity edema, unexplained weight gain, cough, hemoptysis or wheezing.   Past Medical History:  Diagnosis Date   Hypertension     Past Surgical History:  Procedure Laterality Date   JOINT REPLACEMENT     hip    Current Medications: Current Meds  Medication Sig   meclizine (ANTIVERT) 25 MG tablet Take 1 tablet (25 mg total) by mouth 3 (three) times daily as needed for dizziness.   terbinafine (LAMISIL) 250 MG tablet Take 250 mg by mouth daily.   [DISCONTINUED] valsartan (DIOVAN) 40 MG tablet Take 0.5 tablets (20 mg total) by mouth daily.     Allergies:   Patient has no known allergies.   Social History   Socioeconomic History   Marital status: Single    Spouse name: Not on file   Number of children: Not on file   Years of education: Not on file   Highest education level: Not on file  Occupational History   Not on file  Tobacco Use   Smoking status: Never    Passive exposure: Never   Smokeless tobacco: Never  Vaping Use   Vaping Use: Never used  Substance and Sexual Activity   Alcohol use: Never   Drug use: Never   Sexual activity: Not on file  Other Topics Concern   Not on file  Social History Narrative   Not on file   Social Determinants of Health   Financial Resource  Strain: Not on file  Food Insecurity: Not on file  Transportation Needs: Not on file  Physical Activity: Not on file  Stress: Not on file  Social Connections: Not on file     Family History: The patient's family history includes Asthma in his father; Cancer in his father and mother.  ROS:   Please see the history of present illness.     All other systems reviewed and are negative.  EKGs/Labs/Other Studies Reviewed:    The following studies were reviewed today: Notes from 12/27/2021 ED visit and notes from primary care provider  EKG:  EKG is  ordered today.  The ekg ordered today demonstrates sinus bradycardia 58 bpm, normal QRS complex, no repolarization  abnormalities, normal QT C interval.  Recent Labs: 12/27/2021: ALT 11; BUN 16; Creatinine, Ser 1.10; Hemoglobin 13.9; Magnesium 2.0; Platelets 176; Potassium 3.3; Sodium 138  Recent Lipid Panel No results found for: "CHOL", "TRIG", "HDL", "CHOLHDL", "VLDL", "LDLCALC", "LDLDIRECT"   Risk Assessment/Calculations:           Physical Exam:    VS:  BP (!) 154/85   Pulse (!) 58   Ht 5\' 5"  (1.651 m)   Wt 142 lb 6.4 oz (64.6 kg)   SpO2 98%   BMI 23.70 kg/m     Wt Readings from Last 3 Encounters:  02/05/22 142 lb 6.4 oz (64.6 kg)  01/30/22 140 lb (63.5 kg)  01/21/22 140 lb (63.5 kg)     GEN:  Well nourished, well developed in no acute distress HEENT: He has had the resection of the tragus of the left ear; he has partial left facial paralysis involving the distribution of the lower branches of the nerve; multiple missing teeth NECK: No JVD; No carotid bruits.  Scar of old tracheotomy LYMPHATICS: No lymphadenopathy CARDIAC: RRR, no murmurs, rubs, gallops RESPIRATORY:  Clear to auscultation without rales, wheezing or rhonchi  ABDOMEN: Soft, non-tender, non-distended MUSCULOSKELETAL:  No edema; No deformity  SKIN: Warm and dry NEUROLOGIC:  Alert and oriented x 3; neurological deficits involving the left side of his face and the left lower extremity, postpolio PSYCHIATRIC:  Normal affect   ASSESSMENT:    1. QT prolongation   2. Essential (primary) hypertension    PLAN:    In order of problems listed above:  Abnormal ECG: QT interval prolongation is related to electrolyte abnormalities (hypocalcemia and hypokalemia) and has resolved on today's tracing.  I do not think further work-up is needed at this time. HTN: The current dose of valsartan is insufficient for blood pressure control.  I increased it to 80 mg daily.  We provided him with a ambulatory blood pressure cuff that he can use it at home.  He has follow-up with a clinical pharmacist on August 11 at [facility] and with PCP on August 23.  Further adjustment of the valsartan dose can be performed at the time.  Try to reduce sodium rich foods in his diet.     HYPERTENSION CONTROL Vitals:   02/05/22 1306 02/05/22 1329  BP: (!) 174/100 (!) 154/85    The patient's blood pressure is elevated above target today.  The following intervention was performed to address the patient's elevated BP:  - A current anti-hypertensive medication was adjusted today.       Medication Adjustments/Labs and Tests Ordered: Current medicines are reviewed at length with the patient today.  Concerns regarding medicines are outlined above.  Orders Placed This Encounter  Procedures  EKG 12-Lead   Meds ordered this encounter  Medications   valsartan (DIOVAN) 80 MG tablet    Sig: Take 1 tablet (80 mg total) by mouth daily.    Dispense:  90 tablet    Refill:  3    Patient Instructions  Medication Instructions:  INCREASE the Valsartan to 80 mg once daily  *If you need a refill on your cardiac medications before your next appointment, please call your pharmacy*   Lab Work: None ordered If you have labs (blood work) drawn today and your tests are completely normal, you will receive your results only by: MyChart Message (if you have MyChart) OR A paper copy in the mail If you have any lab test that is abnormal or we need to change your treatment, we will call you to review the results.   Testing/Procedures: None ordered   Follow-Up: At [facility], you and your health needs are our priority.  As part of our continuing mission to provide you with exceptional heart care, we have created designated Provider Care Teams.  These Care Teams include your primary Cardiologist (physician) and Advanced Practice Providers (APPs -  Physician Assistants and Nurse Practitioners) who all work together to provide you with the care you need, when you need it.  We recommend signing up for the patient  portal called "MyChart".  Sign up information is provided on this After Visit Summary.  MyChart is used to connect with patients for Virtual Visits (Telemedicine).  Patients are able to view lab/test results, encounter notes, upcoming appointments, etc.  Non-urgent messages can be sent to your provider as well.   To learn more about what you can do with MyChart, go to [email or web address].    Your next appointment:   Follow up as needed with Dr. Royann Shivers  Blood pressure cuff has been given  Other Instructions Dr. Royann Shivers would like you to check your blood pressure daily for the next week.  Keep a journal of these daily blood pressure and heart rate readings and call our office or send a message through MyChart with the results. Thank you!  It is best to check your BP 1-2 hours after taking your medications to see the medications effectiveness on your BP.    Here are some tips that our clinical pharmacists share for home BP monitoring:          Rest 10 minutes before taking your blood pressure.          Don't smoke or drink caffeinated beverages for at least 30 minutes before.          Take your blood pressure before (not after) you eat.          Sit comfortably with your back supported and both feet on the floor (don't cross your legs).          Elevate your arm to heart level on a table or a desk.          Use the proper sized cuff. It should fit smoothly and snugly around your bare upper arm. There should be enough room to slip a fingertip under the cuff. The bottom edge of the cuff should be 1 inch above the crease of the elbow.   Important Information About Sugar         Signed, Thurmon Fair, MD  02/05/2022 1:39 PM    [facility]

## 2022-02-19 ENCOUNTER — Ambulatory Visit (INDEPENDENT_AMBULATORY_CARE_PROVIDER_SITE_OTHER): Payer: Medicare Other | Admitting: Orthopaedic Surgery

## 2022-02-19 DIAGNOSIS — M47812 Spondylosis without myelopathy or radiculopathy, cervical region: Secondary | ICD-10-CM | POA: Diagnosis not present

## 2022-02-19 NOTE — Progress Notes (Signed)
Office Visit Note   Patient: Leonard Green           Date of Birth: [birth year]           MRN: [id number] Visit Date: 02/19/2022              Requested by: Rema Fendt, NP [address] [place],  [place] [address] PCP: Rema Fendt, NP   Assessment & Plan: Visit Diagnoses:  1. Spondylosis without myelopathy or radiculopathy, cervical region     Plan: We discussed radicular myelopathic symptoms.  Currently has neck doing great no problems with it.  He can return if develops symptoms.  Follow-Up Instructions: Return if symptoms worsen or fail to improve.   Orders:  No orders of the defined types were placed in this encounter.  No orders of the defined types were placed in this encounter.     Procedures: No procedures performed   Clinical Data: No additional findings.   Subjective: Chief Complaint  Patient presents with   Neck - Pain, Follow-up    HPI 71 year old male had some problems with dizziness in June.  Had an MRI of his head rule out CVA.  The vertigo resolved but MRI scan of his head noted some multilevel cervical spondylosis as well as plain radiograph.  He presents for follow-up denies any radicular symptoms in his upper extremities no myelopathic symptoms.  States he has good range of motion of cervical spine.  He states he is back driving again and has had no problems with vertigo since his 12/27/2021 visit.  Denies fever chills no bowel bladder associated symptoms.  Review of Systems all other systems noncontributory HPI.   Objective: Vital Signs: There were no vitals taken for this visit.  Physical Exam Constitutional:      Appearance: He is well-developed.  HENT:     Head: Normocephalic and atraumatic.     Right Ear: External ear normal.     Left Ear: External ear normal.     Mouth/Throat:     Comments: Missing multiple teeth. Eyes:     Pupils: Pupils are equal, round, and reactive to light.  Neck:     Thyroid: No  thyromegaly.     Trachea: No tracheal deviation.  Cardiovascular:     Rate and Rhythm: Normal rate.  Pulmonary:     Effort: Pulmonary effort is normal.     Breath sounds: No wheezing.  Abdominal:     General: Bowel sounds are normal.     Palpations: Abdomen is soft.  Musculoskeletal:     Cervical back: Neck supple.  Skin:    General: Skin is warm and dry.     Capillary Refill: Capillary refill takes less than 2 seconds.  Neurological:     Mental Status: He is alert and oriented to person, place, and time.  Psychiatric:        Behavior: Behavior normal.        Thought Content: Thought content normal.        Judgment: Judgment normal.     Ortho Exam good cervical range of motion.  Extremity reflexes 2+ symmetrical normal gait no hyperreflexia no clonus.  Specialty Comments:  No specialty comments available.  Imaging: No results found.   PMFS History: Patient Active Problem List   Diagnosis Date Noted   Spondylosis without myelopathy or radiculopathy, cervical region 02/19/2022   Fracture of femoral neck, left (HCC) 05/21/2011   Past Medical History:  Diagnosis Date  Hypertension     Family History  Problem Relation Age of Onset   Cancer Mother    Asthma Father    Cancer Father     Past Surgical History:  Procedure Laterality Date   JOINT REPLACEMENT     hip   Social History   Occupational History   Not on file  Tobacco Use   Smoking status: Never    Passive exposure: Never   Smokeless tobacco: Never  Vaping Use   Vaping Use: Never used  Substance and Sexual Activity   Alcohol use: Never   Drug use: Never   Sexual activity: Not on file

## 2022-02-22 ENCOUNTER — Encounter: Payer: Self-pay | Admitting: Pharmacist

## 2022-02-22 ENCOUNTER — Ambulatory Visit: Payer: Medicare Other | Attending: Nurse Practitioner | Admitting: Pharmacist

## 2022-02-22 VITALS — BP 154/76 | HR 54

## 2022-02-22 DIAGNOSIS — I10 Essential (primary) hypertension: Secondary | ICD-10-CM | POA: Diagnosis not present

## 2022-02-22 NOTE — Progress Notes (Signed)
   S:     No chief complaint on file.  Leonard Green is a 71 y.o. male who presents for hypertension evaluation, education, and management.  PMH is significant for HTN.  Patient was referred and last seen by Primary Care Provider, Ricky Stabs, on 01/21/2022.   At last visit, BP was elevated. Valsartan started. Since that visit, he was seen by Cardiology and the dose was increased.   Today, patient arrives in good spirits and presents without assistance. Denies dizziness, headache, blurred vision, swelling.   Patient reports hypertension was diagnosed recently. He was increased to valsartan 80 mg daily by his Cardiologist at the end of last month. He just switched to that strength yesterday.   Family/Social history:  Fhx: no pertinent positives  Tobacco: never smoker  Alcohol: none reported   Medication adherence reported . Patient has taken BP medications today.   Current antihypertensives include: valsartan 80 mg daily   Antihypertensives tried in the past include: NKDA, valsartan 40 mg daily   Reported home BP readings: has a cuff but has not checked  -Recalls SBP 138 mmHg  Patient reported dietary habits:  -Tells me that overall 6 weeks, has cut back on salt  -Drinks Coke/Pepsi (3 daily)  Patient-reported exercise habits:  -Pt tells me he is very active. 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   ASCVD risk factors include:    O:   Vitals:   02/22/22 0933  BP: (!) 154/76  Pulse: (!) 54    Last 3 Office BP readings: BP Readings from Last 3 Encounters:  02/05/22 (!) 154/85  01/30/22 (!) 155/83  01/21/22 (!) 175/91    BMET    Component Value Date/Time   NA 138 12/27/2021 1346   K 3.3 (L) 12/27/2021 1346   CL 105 12/27/2021 1346   CO2 20 (L) 12/27/2021 1339   GLUCOSE 156 (H) 12/27/2021 1346   BUN 16 12/27/2021 1346   CREATININE 1.10 12/27/2021 1346   CALCIUM 8.9 12/27/2021 1339   GFRNONAA >60 12/27/2021 1339   GFRAA >90 05/18/2011  0630    Renal function: CrCl cannot be calculated (Patient's most recent lab result is older than the maximum 21 days allowed.).  Clinical ASCVD: No  The ASCVD Risk score (Arnett DK, et al., 2019) failed to calculate for the following reasons:   Cannot find a previous HDL lab   Cannot find a previous total cholesterol lab   A/P: Hypertension newly dx currently above goal on current medications. BP goal < 130/80 mmHg. Medication adherence appears appropriate but he just started the increased dose yesterday.  -Continued valsartan 80 mg daily.  -Patient educated on purpose, proper use, and potential adverse effects of valsartan.  -F/u labs ordered - none -Counseled on lifestyle modifications for blood pressure control including reduced dietary sodium, increased exercise, adequate sleep. -Encouraged patient to check BP at home and bring log of readings to next visit. Counseled on proper use of home BP cuff.    Results reviewed and written information provided.    Written patient instructions provided. Patient verbalized understanding of treatment plan.  Total time in face to face counseling 20 minutes.    Follow-up:  Pharmacist in 4-6 weeks.  Butch Penny, PharmD, Patsy Baltimore, CPP Clinical Pharmacist [facility] [phone]

## 2022-03-06 ENCOUNTER — Encounter: Payer: Self-pay | Admitting: Family

## 2022-03-06 ENCOUNTER — Ambulatory Visit (INDEPENDENT_AMBULATORY_CARE_PROVIDER_SITE_OTHER): Payer: Medicare Other | Admitting: Family

## 2022-03-06 VITALS — BP 139/75 | HR 51 | Temp 98.3°F | Resp 16 | Ht 65.0 in | Wt 138.0 lb

## 2022-03-06 DIAGNOSIS — I10 Essential (primary) hypertension: Secondary | ICD-10-CM | POA: Diagnosis not present

## 2022-03-06 DIAGNOSIS — Z1211 Encounter for screening for malignant neoplasm of colon: Secondary | ICD-10-CM

## 2022-03-06 DIAGNOSIS — Z23 Encounter for immunization: Secondary | ICD-10-CM

## 2022-03-06 DIAGNOSIS — Z0000 Encounter for general adult medical examination without abnormal findings: Secondary | ICD-10-CM

## 2022-03-06 DIAGNOSIS — Z13220 Encounter for screening for lipoid disorders: Secondary | ICD-10-CM

## 2022-03-06 DIAGNOSIS — Z131 Encounter for screening for diabetes mellitus: Secondary | ICD-10-CM

## 2022-03-06 DIAGNOSIS — Z0001 Encounter for general adult medical examination with abnormal findings: Secondary | ICD-10-CM

## 2022-03-06 DIAGNOSIS — Z1159 Encounter for screening for other viral diseases: Secondary | ICD-10-CM

## 2022-03-06 NOTE — Progress Notes (Signed)
Subjective:   Leonard Green is a 70 y.o. male who presents for Medicare Annual/Subsequent preventive examination.  Review of Systems     Cardiac Risk Factors include: advanced age (>24men, >72 women)     Objective:    Today's Vitals   03/06/22 0957 03/06/22 1021  BP: (!) 144/80 139/75  Pulse: (!) 51   Resp: 16   Temp: 98.3 F (36.8 C)   SpO2: 98%   Weight: 138 lb (62.6 kg)   Height: 5\' 5"  (1.651 m)   PainSc: 0-No pain    Body mass index is 22.96 kg/m.     03/06/2022    9:58 AM 12/27/2021    1:33 PM  Advanced Directives  Does Patient Have a Medical Advance Directive? No No  Would patient like information on creating a medical advance directive? Yes (Inpatient - patient defers creating a medical advance directive at this time - Information given) No - Patient declined    Current Medications (verified) Outpatient Encounter Medications as of 03/06/2022  Medication Sig   meclizine (ANTIVERT) 25 MG tablet Take 1 tablet (25 mg total) by mouth 3 (three) times daily as needed for dizziness.   terbinafine (LAMISIL) 250 MG tablet Take 250 mg by mouth daily.   valsartan (DIOVAN) 80 MG tablet Take 1 tablet (80 mg total) by mouth daily.   No facility-administered encounter medications on file as of 03/06/2022.    Allergies (verified) Patient has no known allergies.   History: Past Medical History:  Diagnosis Date   Hypertension    Past Surgical History:  Procedure Laterality Date   JOINT REPLACEMENT     hip   Family History  Problem Relation Age of Onset   Cancer Mother    Asthma Father    Cancer Father    Social History   Socioeconomic History   Marital status: Single    Spouse name: Not on file   Number of children: Not on file   Years of education: Not on file   Highest education level: Not on file  Occupational History   Not on file  Tobacco Use   Smoking status: Never    Passive exposure: Never   Smokeless tobacco: Never  Vaping Use   Vaping Use:  Never used  Substance and Sexual Activity   Alcohol use: Never   Drug use: Never   Sexual activity: Not on file  Other Topics Concern   Not on file  Social History Narrative   Not on file   Social Determinants of Health   Financial Resource Strain: Not on file  Food Insecurity: Not on file  Transportation Needs: Not on file  Physical Activity: Not on file  Stress: Not on file  Social Connections: Not on file    Tobacco Counseling Counseling given: Not Answered   Clinical Intake:  Pre-visit preparation completed: Yes  Pain : 0-10 Pain Score: 0-No pain  Diabetes: No  Interpreter Needed?: No      Activities of Daily Living    03/06/2022    9:58 AM  In your present state of health, do you have any difficulty performing the following activities:  Hearing? 0  Vision? 0  Difficulty concentrating or making decisions? 0  Walking or climbing stairs? 0  Dressing or bathing? 0  Doing errands, shopping? 0  Preparing Food and eating ? N  Using the Toilet? N  In the past six months, have you accidently leaked urine? N  Do you have problems with loss  of bowel control? N  Managing your Medications? N  Managing your Finances? N  Housekeeping or managing your Housekeeping? N    Patient Care Team: Rema Fendt, NP as PCP - General (Nurse Practitioner)  Indicate any recent Medical Services you may have received from other than [facility] providers in the past year (date may be approximate).     Assessment:   This is a routine wellness examination for [place].  Hearing/Vision screen No results found.  Dietary issues and exercise activities discussed: Current Exercise Habits: The patient does not participate in regular exercise at present, Exercise limited by: None identified   Goals Addressed   None   Depression Screen    03/06/2022    9:58 AM 01/21/2022    9:48 AM  PHQ 2/9 Scores  PHQ - 2 Score 0 0    Fall Risk    03/06/2022    9:58 AM 01/21/2022    9:48 AM   Fall Risk   Falls in the past year? 0 0  Number falls in past yr: 0 0  Injury with Fall? 0 0  Risk for fall due to : No Fall Risks No Fall Risks  Follow up Falls evaluation completed Falls evaluation completed    FALL RISK PREVENTION PERTAINING TO THE HOME:  Any stairs in or around the home? No  If so, are there any without handrails?  N/A Home free of loose throw rugs in walkways, pet beds, electrical cords, etc? Yes  Adequate lighting in your home to reduce risk of falls? Yes   ASSISTIVE DEVICES UTILIZED TO PREVENT FALLS:  Life alert? No  Use of a cane, walker or w/c? No  Grab bars in the bathroom? No  Shower chair or bench in shower? No  Elevated toilet seat or a handicapped toilet? No   TIMED UP AND GO:  Was the test performed? .  Length of time to ambulate 10 feet:  sec.     Cognitive Function:    03/06/2022   10:19 AM  MMSE - Mini Mental State Exam  Orientation to time 5  Orientation to Place 5  Registration 3  Attention/ Calculation 5  Recall 3  Language- name 2 objects 2  Language- repeat 1  Language- follow 3 step command 3  Language- read & follow direction 1  Write a sentence 1  Copy design 1  Total score 30        03/06/2022   10:17 AM  6CIT Screen  What Year? 0 points  What month? 0 points  What time? 0 points  Count back from 20 0 points  Months in reverse 0 points  Repeat phrase 0 points  Total Score 0 points    Immunizations Immunization History  Administered Date(s) Administered   Tdap 02/21/2018    TDAP status: Up to date  Flu Vaccine status: Due, Education has been provided regarding the importance of this vaccine. Advised may receive this vaccine at local pharmacy or Health Dept. Aware to provide a copy of the vaccination record if obtained from local pharmacy or Health Dept. Verbalized acceptance and understanding.  Pneumococcal vaccine status: Completed during today's visit.  Covid-19 vaccine status: Completed  vaccines  Qualifies for Shingles Vaccine? Yes   Zostavax completed No   Shingrix Completed?: No.    Education has been provided regarding the importance of this vaccine. Patient has been advised to call insurance company to determine out of pocket expense if they have not yet received this vaccine.  Advised may also receive vaccine at local pharmacy or Health Dept. Verbalized acceptance and understanding.  Screening Tests Health Maintenance  Topic Date Due   COVID-19 Vaccine (1) Never done   Hepatitis C Screening  Never done   COLONOSCOPY (Pts 45-42yrs Insurance coverage will need to be confirmed)  Never done   Zoster Vaccines- Shingrix (1 of 2) Never done   Pneumonia Vaccine 47+ Years old (1 - PCV) Never done   INFLUENZA VACCINE  02/12/2022   TETANUS/TDAP  02/22/2028   HPV VACCINES  Aged Out    Health Maintenance  Health Maintenance Due  Topic Date Due   COVID-19 Vaccine (1) Never done   Hepatitis C Screening  Never done   COLONOSCOPY (Pts 45-70yrs Insurance coverage will need to be confirmed)  Never done   Zoster Vaccines- Shingrix (1 of 2) Never done   Pneumonia Vaccine 15+ Years old (1 - PCV) Never done   INFLUENZA VACCINE  02/12/2022    Colorectal cancer screening: Referral to GI placed pt scheduled w/[facility] for Colonoscopy consultation on 08/30. Pt aware the office will call re: appt.  Lung Cancer Screening: (Low Dose CT Chest recommended if Age 16-80 years, 30 pack-year currently smoking OR have quit w/in 15years.) does not qualify.   Lung Cancer Screening Referral: Never Smoker   Additional Screening:  Hepatitis C Screening: does qualify; Completed 03/06/2022  Vision Screening: Recommended annual ophthalmology exams for early detection of glaucoma and other disorders of the eye. Is the patient up to date with their annual eye exam?  Yes  Who is the provider or what is the name of the office in which the patient attends annual eye exams? Pt unsure, but  declines been referred to eye doctor If pt is not established with a provider, would they like to be referred to a provider to establish care? No .   Dental Screening: Recommended annual dental exams for proper oral hygiene  Community Resource Referral / Chronic Care Management: CRR required this visit?  No   CCM required this visit?  No      Plan:     I have personally reviewed and noted the following in the patient's chart:   Medical and social history Use of alcohol, tobacco or illicit drugs  Current medications and supplements including opioid prescriptions. Patient is not currently taking opioid prescriptions. Functional ability and status Nutritional status Physical activity Advanced directives List of other physicians Hospitalizations, surgeries, and ER visits in previous 12 months Vitals Screenings to include cognitive, depression, and falls Referrals and appointments  In addition, I have reviewed and discussed with patient certain preventive protocols, quality metrics, and best practice recommendations. A written personalized care plan for preventive services as well as general preventive health recommendations were provided to patient.     Rema Fendt, NP   03/06/2022   Nurse Notes:

## 2022-03-06 NOTE — Progress Notes (Signed)
Subjective:   Leonard Green is a 71 y.o. male who presents for Medicare Annual/Subsequent preventive examination.  Review of Systems    Since last visit established with Cardiology and doing well.    Objective:    Today's Vitals   03/06/22 0957 03/06/22 1021  BP: (!) 144/80 139/75  Pulse: (!) 51   Resp: 16   Temp: 98.3 F (36.8 C)   SpO2: 98%   Weight: 138 lb (62.6 kg)   Height: 5\' 5"  (1.651 m)   PainSc: 0-No pain    Body mass index is 22.96 kg/m.  Physical Exam HENT:     Head: Normocephalic and atraumatic.     Right Ear: Tympanic membrane, ear canal and external ear normal.     Left Ear: Tympanic membrane, ear canal and external ear normal.     Nose: Nose normal.     Mouth/Throat:     Mouth: Mucous membranes are moist.     Pharynx: Oropharynx is clear.  Eyes:     Extraocular Movements: Extraocular movements intact.     Conjunctiva/sclera: Conjunctivae normal.     Pupils: Pupils are equal, round, and reactive to light.  Cardiovascular:     Rate and Rhythm: Normal rate and regular rhythm.     Pulses: Normal pulses.     Heart sounds: Normal heart sounds.  Pulmonary:     Effort: Pulmonary effort is normal.     Breath sounds: Normal breath sounds.  Abdominal:     General: Bowel sounds are normal.     Palpations: Abdomen is soft.  Genitourinary:    Comments: Patient declined.  Musculoskeletal:        General: Normal range of motion.     Right shoulder: Normal.     Left shoulder: Normal.     Right upper arm: Normal.     Left upper arm: Normal.     Right elbow: Normal.     Left elbow: Normal.     Right forearm: Normal.     Left forearm: Normal.     Right wrist: Normal.     Left wrist: Normal.     Right hand: Normal.     Left hand: Normal.     Cervical back: Normal, normal range of motion and neck supple.     Thoracic back: Normal.     Lumbar back: Normal.     Right hip: Normal.     Left hip: Normal.     Right upper leg: Normal.     Left upper leg:  Normal.     Right knee: Normal.     Left knee: Normal.     Right lower leg: Normal.     Left lower leg: Normal.     Right ankle: Normal.     Left ankle: Normal.     Left foot: Normal.     Comments: Right foot significantly smaller than left foot. This is related to patient's history of childhood polio. Right foot size 5. Left foot size 10.  Skin:    General: Skin is warm and dry.     Capillary Refill: Capillary refill takes less than 2 seconds.  Neurological:     General: No focal deficit present.     Mental Status: He is alert and oriented to person, place, and time.  Psychiatric:        Mood and Affect: Mood normal.        Behavior: Behavior normal.       03/06/2022  9:58 AM 12/27/2021    1:33 PM  Advanced Directives  Does Patient Have a Medical Advance Directive? No No  Would patient like information on creating a medical advance directive? Yes (Inpatient - patient defers creating a medical advance directive at this time - Information given) No - Patient declined    Current Medications (verified) Outpatient Encounter Medications as of 03/06/2022  Medication Sig   meclizine (ANTIVERT) 25 MG tablet Take 1 tablet (25 mg total) by mouth 3 (three) times daily as needed for dizziness.   terbinafine (LAMISIL) 250 MG tablet Take 250 mg by mouth daily.   valsartan (DIOVAN) 80 MG tablet Take 1 tablet (80 mg total) by mouth daily.   No facility-administered encounter medications on file as of 03/06/2022.    Allergies (verified) Patient has no known allergies.   History: Past Medical History:  Diagnosis Date   Hypertension    Past Surgical History:  Procedure Laterality Date   JOINT REPLACEMENT     hip   Family History  Problem Relation Age of Onset   Cancer Mother    Asthma Father    Cancer Father    Social History   Socioeconomic History   Marital status: Single    Spouse name: Not on file   Number of children: Not on file   Years of education: Not on file    Highest education level: Not on file  Occupational History   Not on file  Tobacco Use   Smoking status: Never    Passive exposure: Never   Smokeless tobacco: Never  Vaping Use   Vaping Use: Never used  Substance and Sexual Activity   Alcohol use: Never   Drug use: Never   Sexual activity: Not on file  Other Topics Concern   Not on file  Social History Narrative   Not on file   Social Determinants of Health   Financial Resource Strain: Not on file  Food Insecurity: Not on file  Transportation Needs: Not on file  Physical Activity: Not on file  Stress: Not on file  Social Connections: Not on file    Tobacco Counseling Never smoked.  Clinical Intake: Pre-visit preparation completed: Yes Pain : 0-10 Pain Score: 0-No pain Diabetes: No Interpreter Needed?: No    Activities of Daily Living    03/06/2022    9:58 AM  In your present state of health, do you have any difficulty performing the following activities:  Hearing? 0  Vision? 0  Difficulty concentrating or making decisions? 0  Walking or climbing stairs? 0  Dressing or bathing? 0  Doing errands, shopping? 0  Preparing Food and eating ? N  Using the Toilet? N  In the past six months, have you accidently leaked urine? N  Do you have problems with loss of bowel control? N  Managing your Medications? N  Managing your Finances? N  Housekeeping or managing your Housekeeping? N    Patient Care Team: Rema Fendt, NP as PCP - General (Family Medicine)  Indicate any recent Medical Services you may have received from other than [facility] providers in the past year (date may be approximate).     Assessment:   This is a routine wellness examination for [place].  Hearing/Vision screen - Reports using readers as needed. Has established eye doctor should he need it. - Reports decreased hearing right ear. He is established with an ear doctor.  Dietary issues and exercise activities discussed: Current Exercise Habits:  The patient does not participate  in regular exercise at present, Exercise limited by: None identified  Goals Addressed: Patient states he has no health-related goals.  Depression Screen    03/06/2022    9:58 AM 01/21/2022    9:48 AM  PHQ 2/9 Scores  PHQ - 2 Score 0 0    Fall Risk    03/06/2022    9:58 AM 01/21/2022    9:48 AM  Fall Risk   Falls in the past year? 0 0  Number falls in past yr: 0 0  Injury with Fall? 0 0  Risk for fall due to : No Fall Risks No Fall Risks  Follow up Falls evaluation completed Falls evaluation completed    FALL RISK PREVENTION PERTAINING TO THE HOME: Any stairs in or around the home? No  If so, are there any without handrails? N/A Home free of loose throw rugs in walkways, pet beds, electrical cords, etc? Yes  Adequate lighting in your home to reduce risk of falls? Yes   ASSISTIVE DEVICES UTILIZED TO PREVENT FALLS: Life alert? No  Use of a cane, walker or w/c? No  Grab bars in the bathroom? No  Shower chair or bench in shower? No  Elevated toilet seat or a handicapped toilet? No   TIMED UP AND GO: Was the test performed? Yes .  Length of time to ambulate 10 feet: 8 sec.   Gait steady and fast without use of assistive device  Cognitive Function:    03/06/2022   10:19 AM  MMSE - Mini Mental State Exam  Orientation to time 5  Orientation to Place 5  Registration 3  Attention/ Calculation 5  Recall 3  Language- name 2 objects 2  Language- repeat 1  Language- follow 3 step command 3  Language- read & follow direction 1  Write a sentence 1  Copy design 1  Total score 30        03/06/2022   10:17 AM  6CIT Screen  What Year? 0 points  What month? 0 points  What time? 0 points  Count back from 20 0 points  Months in reverse 0 points  Repeat phrase 0 points  Total Score 0 points    Immunizations Immunization History  Administered Date(s) Administered   Tdap 02/21/2018    TDAP status: Up to date  Flu Vaccine status:  Due, Education has been provided regarding the importance of this vaccine. Advised may receive this vaccine at local pharmacy or Health Dept. Aware to provide a copy of the vaccination record if obtained from local pharmacy or Health Dept. Verbalized acceptance and understanding.  Pneumococcal vaccine status: Completed during today's visit.  Covid-19 vaccine status: Completed vaccines  Qualifies for Shingles Vaccine? Yes   Zostavax completed No   Shingrix Completed?: No.    Education has been provided regarding the importance of this vaccine. Patient has been advised to call insurance company to determine out of pocket expense if they have not yet received this vaccine. Advised may also receive vaccine at local pharmacy or Health Dept. Verbalized acceptance and understanding.  Screening Tests Health Maintenance  Topic Date Due   COVID-19 Vaccine (1) Never done   Hepatitis C Screening  Never done   COLONOSCOPY (Pts 45-29yrs Insurance coverage will need to be confirmed)  Never done   Zoster Vaccines- Shingrix (1 of 2) Never done   Pneumonia Vaccine 44+ Years old (1 - PCV) Never done   INFLUENZA VACCINE  02/12/2022   TETANUS/TDAP  02/22/2028   HPV  VACCINES  Aged Out    Health Maintenance  Health Maintenance Due  Topic Date Due   COVID-19 Vaccine (1) Never done   Hepatitis C Screening  Never done   COLONOSCOPY (Pts 45-30yrs Insurance coverage will need to be confirmed)  Never done   Zoster Vaccines- Shingrix (1 of 2) Never done   Pneumonia Vaccine 60+ Years old (1 - PCV) Never done   INFLUENZA VACCINE  02/12/2022    Colorectal cancer screening: Referral to GI placed pt scheduled w/[facility] for Colonoscopy consultation on 03/13/2022. Pt aware the office will call re: appt.  Lung Cancer Screening: (Low Dose CT Chest recommended if Age 57-80 years, 30 pack-year currently smoking OR have quit w/in 15years.) does not qualify.   Lung Cancer Screening Referral: Never Smoker    Additional Screening: Hepatitis C Screening: does qualify; Completed 03/06/2022  Vision Screening: Recommended annual ophthalmology exams for early detection of glaucoma and other disorders of the eye. Is the patient up to date with their annual eye exam?  Yes  Who is the provider or what is the name of the office in which the patient attends annual eye exams? Pt unsure, but declines been referred to eye doctor If pt is not established with a provider, would they like to be referred to a provider to establish care? No .   Dental Screening: Recommended annual dental exams for proper oral hygiene  Community Resource Referral / Chronic Care Management: CRR required this visit?  No   CCM required this visit?  No      Plan:  1. Medicare annual wellness visit, subsequent - Counseled on 150 minutes of exercise per week as tolerated, healthy eating (including decreased daily intake of saturated fats, cholesterol, added sugars, sodium), STI prevention, and routine healthcare maintenance.  2. Diabetes mellitus screening - Hemoglobin A1c to screen for pre-diabetes/diabetes. - Hemoglobin A1c  3. Screening cholesterol level - Lipid panel to screen for high cholesterol.  - Lipid panel  4. Screen for colon cancer - Patient reports has colonoscopy consultation on 03/13/2022 at [facility].   5. Need for hepatitis C screening test - Hepatitis C antibody to screen for hepatitis C.  - Hepatitis C Antibody  6. Need for pneumococcal vaccination - Administered.  - Pneumococcal polysaccharide vaccine 23-valent greater than or equal to 2yo subcutaneous/IM  7. Essential (primary) hypertension - Keep all scheduled appointments with Cardiology.     I have personally reviewed and noted the following in the patient's chart:   Medical and social history Use of alcohol, tobacco or illicit drugs  Current medications and supplements including opioid prescriptions. Patient is not  currently taking opioid prescriptions. Functional ability and status Nutritional status Physical activity Advanced directives List of other physicians Hospitalizations, surgeries, and ER visits in previous 12 months Vitals Screenings to include cognitive, depression, and falls Referrals and appointments  In addition, I have reviewed and discussed with patient certain preventive protocols, quality metrics, and best practice recommendations. A written personalized care plan for preventive services as well as general preventive health recommendations were provided to patient.     Rema Fendt, NP   03/06/2022

## 2022-03-06 NOTE — Patient Instructions (Signed)
  Leonard Green , Thank you for taking time to come for your Medicare Wellness Visit. I appreciate your ongoing commitment to your health goals. Please review the following plan we discussed and let me know if I can assist you in the future.   This is a list of the screening recommended for you and due dates:  Health Maintenance  Topic Date Due   COVID-19 Vaccine (1) Never done   Hepatitis C Screening: USPSTF Recommendation to screen - Ages 63-72 yo.  Never done   Colon Cancer Screening  Never done   Zoster (Shingles) Vaccine (1 of 2) Never done   Pneumonia Vaccine (1 - PCV) Never done   Flu Shot  02/12/2022   Tetanus Vaccine  02/22/2028   HPV Vaccine  Aged Out

## 2022-03-07 ENCOUNTER — Encounter: Payer: Self-pay | Admitting: Family

## 2022-03-07 ENCOUNTER — Other Ambulatory Visit: Payer: Self-pay | Admitting: Family

## 2022-03-07 DIAGNOSIS — E785 Hyperlipidemia, unspecified: Secondary | ICD-10-CM

## 2022-03-07 DIAGNOSIS — R7303 Prediabetes: Secondary | ICD-10-CM

## 2022-03-07 LAB — LIPID PANEL
Chol/HDL Ratio: 3.8 ratio (ref 0.0–5.0)
Cholesterol, Total: 188 mg/dL (ref 100–199)
HDL: 50 mg/dL (ref >39)
LDL Chol Calc (NIH): 124 mg/dL — ABNORMAL HIGH (ref 0–99)
Triglycerides: 73 mg/dL (ref 0–149)
VLDL Cholesterol Cal: 14 mg/dL (ref 5–40)

## 2022-03-07 LAB — HEMOGLOBIN A1C
Est. average glucose Bld gHb Est-mCnc: 126 mg/dL
Hgb A1c MFr Bld: 6 % — ABNORMAL HIGH (ref 4.8–5.6)

## 2022-03-07 LAB — HEPATITIS C ANTIBODY: Hep C Virus Ab: NONREACTIVE

## 2022-03-07 MED ORDER — ATORVASTATIN CALCIUM 20 MG PO TABS: 20.0000 mg | ORAL_TABLET | Freq: Every day | ORAL | 0 refills | Status: DC

## 2022-04-04 ENCOUNTER — Other Ambulatory Visit: Payer: Medicare Other

## 2022-04-04 ENCOUNTER — Other Ambulatory Visit: Payer: Self-pay | Admitting: *Deleted

## 2022-04-04 DIAGNOSIS — E785 Hyperlipidemia, unspecified: Secondary | ICD-10-CM

## 2022-04-05 ENCOUNTER — Other Ambulatory Visit: Payer: Self-pay | Admitting: Family

## 2022-04-05 ENCOUNTER — Ambulatory Visit: Payer: Medicare Other | Attending: Family | Admitting: Pharmacist

## 2022-04-05 ENCOUNTER — Encounter: Payer: Self-pay | Admitting: Pharmacist

## 2022-04-05 VITALS — BP 146/66 | HR 54

## 2022-04-05 DIAGNOSIS — I10 Essential (primary) hypertension: Secondary | ICD-10-CM

## 2022-04-05 DIAGNOSIS — E785 Hyperlipidemia, unspecified: Secondary | ICD-10-CM

## 2022-04-05 LAB — LIPID PANEL
Chol/HDL Ratio: 3.7 ratio (ref 0.0–5.0)
Cholesterol, Total: 220 mg/dL — ABNORMAL HIGH (ref 100–199)
HDL: 60 mg/dL (ref >39)
LDL Chol Calc (NIH): 151 mg/dL — ABNORMAL HIGH (ref 0–99)
Triglycerides: 51 mg/dL (ref 0–149)
VLDL Cholesterol Cal: 9 mg/dL (ref 5–40)

## 2022-04-05 MED ORDER — VALSARTAN 160 MG PO TABS: 160.0000 mg | ORAL_TABLET | Freq: Every day | ORAL | 3 refills | Status: DC

## 2022-04-05 NOTE — Progress Notes (Signed)
S:     No chief complaint on file.  Leonard Green is a 71 y.o. male who presents for hypertension evaluation, education, and management.  PMH is significant for HTN.  Patient was referred and last seen by Primary Care Provider, Durene Fruits, on 02/22/2022.   At that visit, BP was 139/75 mmHg. No changes were made to medications.  Today, patient arrives in good spirits and presents without assistance. Denies dizziness, headache, blurred vision, swelling.   Family/Social history:  Fhx: no pertinent positives  Tobacco: never smoker  Alcohol: none reported   Medication adherence reported. Patient has taken BP medications today.   Current antihypertensives include: valsartan 80 mg daily   Antihypertensives tried in the past include: NKDA, valsartan 40 mg daily   Reported home BP readings:  -Recalls SBP 138-143 mmHg since last visit  Patient reported dietary habits:  -Tells me that overall 6 weeks, has cut back on salt  -Drinks Coke/Pepsi (1 daily). This amount has decreased from 3 daily   Patient-reported exercise habits:  -Pt tells me he is very active. 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   O:   Vitals:   04/05/22 0857  BP: (!) 146/66  Pulse: (!) 54    Last 3 Office BP readings: BP Readings from Last 3 Encounters:  04/05/22 (!) 146/66  03/06/22 139/75  02/22/22 (!) 154/76    BMET    Component Value Date/Time   NA 138 12/27/2021 1346   K 3.3 (L) 12/27/2021 1346   CL 105 12/27/2021 1346   CO2 20 (L) 12/27/2021 1339   GLUCOSE 156 (H) 12/27/2021 1346   BUN 16 12/27/2021 1346   CREATININE 1.10 12/27/2021 1346   CALCIUM 8.9 12/27/2021 1339   GFRNONAA >60 12/27/2021 1339   GFRAA >90 05/18/2011 0630    Renal function: CrCl cannot be calculated (Patient's most recent lab result is older than the maximum 21 days allowed.).  Clinical ASCVD: No  The 10-year ASCVD risk score (Arnett DK, et al., 2019) is: 26.6%   Values used to  calculate the score:     Age: 2 years     Sex: Male     Is Non-Hispanic African American: No     Diabetic: No     Tobacco smoker: No     Systolic Blood Pressure: 041 mmHg     Is BP treated: Yes     HDL Cholesterol: 50 mg/dL     Total Cholesterol: 188 mg/dL   A/P: Hypertension currently above goal on current medications. BP goal < 130/80 mmHg. Medication adherence appears appropriate. Will increase valsartan dose and get labs today. -Increased dose of valsartan to 160 mg daily.  -Patient educated on purpose, proper use, and potential adverse effects of valsartan.  -F/u labs ordered - BMP6+eGFR -Counseled on lifestyle modifications for blood pressure control including reduced dietary sodium, increased exercise, adequate sleep. -Encouraged patient to check BP at home and bring log of readings to next visit. Counseled on proper use of home BP cuff.    Results reviewed and written information provided.    Written patient instructions provided. Patient verbalized understanding of treatment plan.  Total time in face to face counseling 20 minutes.    Follow-up:  Pharmacist in 4-6 weeks.  Benard Halsted, PharmD, [facility], [place] [phone]

## 2022-04-06 LAB — BMP8+EGFR
BUN/Creatinine Ratio: 15 (ref 10–24)
BUN: 16 mg/dL (ref 8–27)
CO2: 21 mmol/L (ref 20–29)
Calcium: 9.1 mg/dL (ref 8.6–10.2)
Chloride: 104 mmol/L (ref 96–106)
Creatinine, Ser: 1.07 mg/dL (ref 0.76–1.27)
Glucose: 102 mg/dL — ABNORMAL HIGH (ref 70–99)
Potassium: 4 mmol/L (ref 3.5–5.2)
Sodium: 142 mmol/L (ref 134–144)
eGFR: 74 mL/min/{1.73_m2} (ref >59)

## 2022-04-17 ENCOUNTER — Ambulatory Visit: Payer: Medicare Other | Attending: Cardiovascular Disease

## 2022-04-17 NOTE — Progress Notes (Deleted)
   04/17/2022 [name] 06/13/1951 [id number]   HPI:  Leonard Green is a 71 y.o. male patient of Dr Sallyanne Kuster, who presents today for a lipid clinic evaluation.  See pertinent past medical history below.  Past Medical History: hypertension Not controlled, valsartan increased to 80 mg daily at last visit  QT prolongation Related to hypocalcemia, hypokalemia - resolved  polio As toddler, residual right facial pasy and left leg paresis    Insurance Carrier:   [employer] Dual Complete      Current Medications:        Cholesterol Goals:   Intolerant/previously tried:         Family history:   Diet:   Exercise:    Labs: 04/04/22:  TC 220, TG 51, HDL 60, LDL 151        Current Outpatient Medications  Medication Sig Dispense Refill   atorvastatin (LIPITOR) 20 MG tablet Take 1 tablet (20 mg total) by mouth daily. 90 tablet 0   meclizine (ANTIVERT) 25 MG tablet Take 1 tablet (25 mg total) by mouth 3 (three) times daily as needed for dizziness. 30 tablet 0   terbinafine (LAMISIL) 250 MG tablet Take 250 mg by mouth daily.     valsartan (DIOVAN) 160 MG tablet Take 1 tablet (160 mg total) by mouth daily. 90 tablet 3   No current facility-administered medications for this visit.    No Known Allergies  Past Medical History:  Diagnosis Date   Hypertension     There were no vitals taken for this visit.   No problem-specific Assessment & Plan notes found for this encounter.   Tommy Medal PharmD CPP [facility] [address] [place], [place] [address] [phone]

## 2022-05-17 ENCOUNTER — Ambulatory Visit: Payer: Medicare Other | Attending: Family | Admitting: Pharmacist

## 2022-05-17 ENCOUNTER — Encounter: Payer: Self-pay | Admitting: Pharmacist

## 2022-05-17 VITALS — BP 148/81

## 2022-05-17 DIAGNOSIS — I10 Essential (primary) hypertension: Secondary | ICD-10-CM

## 2022-05-17 NOTE — Progress Notes (Signed)
S:     No chief complaint on file.  Leonard Green is a 71 y.o. male who presents for hypertension evaluation, education, and management.  PMH is significant for HTN.  Patient was referred and last seen by Primary Care Provider, Durene Fruits, on 02/22/2022. I've seen him a couple of times since with his last visit on 04/05/2022. I increased his valsartan dose at that visit.    Today, patient arrives in good spirits and presents without assistance. Denies dizziness, headache, blurred vision, swelling.   Family/Social history:  Fhx: no pertinent positives  Tobacco: never smoker  Alcohol: none reported   Medication adherence reported. Patient has taken BP medications today but took ~0730 this morning.   Current antihypertensives include: valsartan 160mg  daily   Reported home BP readings:  -No log or machine with him today.  -He tells me he saw one BP reading in the 90s/60s since increasing the valsartan dose but most SBP readings have been in the 130s.  Patient reported dietary habits:  -Tells me that overall he has cut back on salt  -Drinks Coke/Pepsi (1 daily).  Patient-reported exercise habits:  -Pt tells me he is very active. 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   O:  Vitals:   05/17/22 0857  BP: (!) 148/81    Last 3 Office BP readings: BP Readings from Last 3 Encounters:  05/17/22 (!) 148/81  04/05/22 (!) 146/66  03/06/22 139/75    BMET    Component Value Date/Time   NA 142 04/05/2022 0903   K 4.0 04/05/2022 0903   CL 104 04/05/2022 0903   CO2 21 04/05/2022 0903   GLUCOSE 102 (H) 04/05/2022 0903   GLUCOSE 156 (H) 12/27/2021 1346   BUN 16 04/05/2022 0903   CREATININE 1.07 04/05/2022 0903   CALCIUM 9.1 04/05/2022 0903   GFRNONAA >60 12/27/2021 1339   GFRAA >90 05/18/2011 0630    Renal function: CrCl cannot be calculated (Patient's most recent lab result is older than the maximum 21 days allowed.).  Clinical ASCVD: No  The  10-year ASCVD risk score (Arnett DK, et al., 2019) is: 27.1%   Values used to calculate the score:     Age: 62 years     Sex: Male     Is Non-Hispanic African American: No     Diabetic: No     Tobacco smoker: No     Systolic Blood Pressure: 123456 mmHg     Is BP treated: Yes     HDL Cholesterol: 60 mg/dL     Total Cholesterol: 220 mg/dL   A/P: Hypertension currently above goal on current medications. BP goal < 130/80 mmHg. Medication adherence appears appropriate. There seems to be some difference, possible white coat hypertension, in home vs clinic readings. I am unable to verify but he endorses a home BP reading that was close to being hypotensive since his last visit with me. He denies any sx of hypotension at home. Furthermore, I increased his dose at his last visit and his clinic BP is essentially unchanged from that reading.Will hold off on medication changes today. I instructed him to bring his cuff in when he follows with Amy on 05/29/2022 so we can compare readings.  -No changes today. Continue valsartan 160 mg daily.  -Patient educated on purpose, proper use, and potential adverse effects of valsartan.  -F/u labs ordered - none today  -Counseled on lifestyle modifications for blood pressure control including reduced dietary  sodium, increased exercise, adequate sleep. -Encouraged patient to check BP at home and bring log of readings to next visit. Counseled on proper use of home BP cuff.    Results reviewed and written information provided.    Written patient instructions provided. Patient verbalized understanding of treatment plan.  Total time in face to face counseling 20 minutes.    Follow-up:  PCP 05/29/2022.  Benard Halsted, PharmD, [facility], [place] [phone]

## 2022-05-29 ENCOUNTER — Encounter: Payer: Self-pay | Admitting: Pharmacist

## 2022-05-29 ENCOUNTER — Ambulatory Visit: Payer: Medicare Other | Attending: Internal Medicine | Admitting: Pharmacist

## 2022-05-29 DIAGNOSIS — E7849 Other hyperlipidemia: Secondary | ICD-10-CM

## 2022-05-29 MED ORDER — ROSUVASTATIN CALCIUM 20 MG PO TABS: 20.0000 mg | ORAL_TABLET | Freq: Every day | ORAL | 1 refills | Status: DC

## 2022-05-29 NOTE — Patient Instructions (Signed)
It was nice meeting you today  We would like your LDL (bad cholesterol) to be less than 70  Please start rosuvastatin 20mg  once a day  We will check your liver function in about 2 weeks and then recheck your cholesterol in about 2-3 months.   Please let know if you have questions or adverse effects  [place], PharmD, BCACP, CDCES, CPP [address], [address] [place], [place], [place] Phone: [phone], Fax: [phone]

## 2022-05-29 NOTE — Progress Notes (Signed)
Patient ID: Leonard Green                 DOB: Aug 14, 1950                    MRN: [id number]     HPI: Leonard Green is a 71 y.o. male patient referred to lipid clinic by Delfin Gant. PMH is significant for HLD, HTN, and Pre DM.  Patient presents today in good spirits. Did not know he had elevated cholesterol. Reports he has not started atorvastatin 20mg  because he said he did not know he was supposed to. Only medication he takes is valsartan.  Denies tobacco or alcohol. Does not follow any diet plan. Is very physically active however. 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  Current Medications: N/A  Intolerances: N/A  LDL goal: <70  Labs: TC 220, Trigs 51, HDL 60, LDL 151 (04/04/22)  Past Medical History:  Diagnosis Date   Hypertension     Current Outpatient Medications on File Prior to Visit  Medication Sig Dispense Refill   atorvastatin (LIPITOR) 20 MG tablet Take 1 tablet (20 mg total) by mouth daily. 90 tablet 0   meclizine (ANTIVERT) 25 MG tablet Take 1 tablet (25 mg total) by mouth 3 (three) times daily as needed for dizziness. 30 tablet 0   terbinafine (LAMISIL) 250 MG tablet Take 250 mg by mouth daily.     valsartan (DIOVAN) 160 MG tablet Take 1 tablet (160 mg total) by mouth daily. 90 tablet 3   No current facility-administered medications on file prior to visit.    No Known Allergies  Assessment/Plan:  1. Hyperlipidemia - Patient current LDL 151 which is above goal of <70. Despite having atorvastatin on his medication list patient reports he did not know what this medication was and he has never taken it.  Patient needs lipid lowering. Will start rosuvastatin 20mg  once daily. Advised of common side effects such as myalgias and liver injury. Will check LFTs in ~2 weejs. Recheck lipid panel in 2-3 months. If still above goal will increase Crestor to 40mg  and consider Zetia.  Start rosuvastatin 20 mg daily Recheck lipid panel in  2-3 months Check LFTS in ~ 2 weeks   04/06/22, PharmD, BCACP, CDCES, CPP [address], [address] [place], [place], [address] Phone: [phone], Fax: [phone]

## 2022-07-16 ENCOUNTER — Encounter: Payer: Self-pay | Admitting: Physician Assistant

## 2022-07-16 ENCOUNTER — Ambulatory Visit: Payer: Medicare Other | Admitting: Physician Assistant

## 2022-07-16 ENCOUNTER — Ambulatory Visit: Payer: Self-pay | Admitting: *Deleted

## 2022-07-16 VITALS — BP 120/65 | HR 71 | Wt 148.0 lb

## 2022-07-16 DIAGNOSIS — B9689 Other specified bacterial agents as the cause of diseases classified elsewhere: Secondary | ICD-10-CM | POA: Diagnosis not present

## 2022-07-16 DIAGNOSIS — J208 Acute bronchitis due to other specified organisms: Secondary | ICD-10-CM | POA: Diagnosis not present

## 2022-07-16 DIAGNOSIS — R1319 Other dysphagia: Secondary | ICD-10-CM

## 2022-07-16 MED ORDER — AZITHROMYCIN 250 MG PO TABS: ORAL_TABLET | ORAL | 0 refills | Status: DC

## 2022-07-16 MED ORDER — BENZONATATE 100 MG PO CAPS: 100.0000 mg | ORAL_CAPSULE | Freq: Two times a day (BID) | ORAL | 0 refills | Status: DC | PRN

## 2022-07-16 NOTE — Progress Notes (Unsigned)
Established Patient Office Visit  Subjective   Patient ID: Leonard Green, male    DOB: 1951/04/29  Age: 72 y.o. MRN: [id number]  Chief Complaint  Patient presents with   Sinusitis    Patient states he has been feeling bad since Christmas day    Choking    Patient states roommate, gave him a pill yesterday to help with cold, he states the pill got hung in his throat ad since then he been experiencing pain when he swallows. Patent has hx of tract.    States that he has been having a productive cough with bright green sputum for the past 10 days.  States that he is also having bright green nasal discharge.  States that he has been experiencing a nonmeasured fever, states that he believes he "broke the fever last night" states that he woke up drenched in sweat.  States that he also did experience a small amount of dizziness last night but states that has resolved.  States that he is also concerned that he had difficulty swallowing a large pill yesterday, states that he felt it was a hung up where he previously had a tracheotomy in 1952 due to polio.  States that he is otherwise eating and drinking and taking pills okay.    States that other than the large pill he took yesterday which was some type of cold medication that he is unsure of, he has not tried anything else for relief.    Past Medical History:  Diagnosis Date   Hypertension    Social History   Socioeconomic History   Marital status: Single    Spouse name: Not on file   Number of children: Not on file   Years of education: Not on file   Highest education level: Not on file  Occupational History   Not on file  Tobacco Use   Smoking status: Never    Passive exposure: Never   Smokeless tobacco: Never  Vaping Use   Vaping Use: Never used  Substance and Sexual Activity   Alcohol use: Never   Drug use: Never   Sexual activity: Not on file  Other Topics Concern   Not on file  Social History Narrative   Not on  file   Social Determinants of Health   Financial Resource Strain: Not on file  Food Insecurity: Not on file  Transportation Needs: Not on file  Physical Activity: Not on file  Stress: Not on file  Social Connections: Not on file  Intimate Partner Violence: Not on file   Family History  Problem Relation Age of Onset   Cancer Mother    Asthma Father    Cancer Father    No Known Allergies  Review of Systems  Constitutional:  Positive for chills and fever.  HENT:  Positive for congestion. Negative for ear pain, sinus pain and sore throat.   Eyes: Negative.   Respiratory:  Positive for cough and sputum production. Negative for shortness of breath and wheezing.   Cardiovascular:  Negative for chest pain.  Gastrointestinal:  Negative for abdominal pain, nausea and vomiting.  Genitourinary:  Negative for dysuria.  Musculoskeletal:  Negative for myalgias.  Skin: Negative.   Neurological:  Positive for dizziness.  Endo/Heme/Allergies: Negative.   Psychiatric/Behavioral: Negative.        Objective:     BP 120/65 (BP Location: Left Leg, Patient Position: Sitting, Cuff Size: Small)   Pulse 71   Wt 148 lb (67.1 kg)  SpO2 99%   BMI 24.63 kg/m    Physical Exam Vitals and nursing note reviewed.  Constitutional:      Appearance: Normal appearance.  HENT:     Head: Normocephalic and atraumatic.     Salivary Glands: Right salivary gland is diffusely enlarged and tender. Left salivary gland is diffusely enlarged and tender.     Right Ear: Tympanic membrane, ear canal and external ear normal.     Left Ear: Tympanic membrane, ear canal and external ear normal.     Nose: Nose normal.     Right Turbinates: Enlarged and swollen.     Left Turbinates: Enlarged and swollen.     Right Sinus: No maxillary sinus tenderness or frontal sinus tenderness.     Left Sinus: No maxillary sinus tenderness or frontal sinus tenderness.     Mouth/Throat:     Lips: Pink.     Mouth: Mucous  membranes are moist.     Dentition: Abnormal dentition. Dental caries present.     Pharynx: Oropharynx is clear.     Tonsils: No tonsillar exudate.  Cardiovascular:     Rate and Rhythm: Normal rate and regular rhythm.     Pulses: Normal pulses.     Heart sounds: Normal heart sounds.  Pulmonary:     Effort: Pulmonary effort is normal.     Breath sounds: Normal breath sounds. No wheezing.  Musculoskeletal:        General: Normal range of motion.     Cervical back: Normal range of motion and neck supple.  Skin:    General: Skin is warm and dry.  Neurological:     General: No focal deficit present.     Mental Status: He is alert and oriented to person, place, and time.  Psychiatric:        Mood and Affect: Mood normal.        Behavior: Behavior normal.        Thought Content: Thought content normal.        Judgment: Judgment normal.        Assessment & Plan:   Problem List Items Addressed This Visit       Digestive   Other dysphagia   Relevant Orders   Ambulatory referral to Gastroenterology   Other Visit Diagnoses     Acute bacterial bronchitis    -  Primary   Relevant Medications   azithromycin (ZITHROMAX) 250 MG tablet   benzonatate (TESSALON) 100 MG capsule      1. Acute bacterial bronchitis Trial azithromycin, Tessalon Perles.  Patient education given on supportive care, red flags given for prompt reevaluation - azithromycin (ZITHROMAX) 250 MG tablet; Take 2 tabs PO day 1, then take 1 tab PO once daily  Dispense: 6 tablet; Refill: 0 - benzonatate (TESSALON) 100 MG capsule; Take 1 capsule (100 mg total) by mouth 2 (two) times daily as needed for cough.  Dispense: 20 capsule; Refill: 0  2. Other dysphagia Patient education given on red flags, supportive care. - Ambulatory referral to Gastroenterology    I have reviewed the patient's medical history (PMH, PSH, Social History, Family History, Medications, and allergies) , and have been updated if relevant. I  spent 30 minutes reviewing chart and  face to face time with patient.    Return if symptoms worsen or fail to improve.    Loraine Grip Mayers, PA-C

## 2022-07-16 NOTE — Telephone Encounter (Signed)
Reason for Disposition  [1] Continuous (nonstop) coughing interferes with work or school AND [2] no improvement using cough treatment per Care Advice    Going to the [facility] now.   Just talked with another triage nurse.  Answer Assessment - Initial Assessment Questions 1. ONSET: "When did the cough begin?"      The last 2-3 days on the coughing and I'm doing better.   I swallowed a pill and it got stuck in my throat.   I've had a tracheotomy in 1952 with Polio.   I just talked with a nurse on the other line while you were talking with [name].   The other nurse told me to go to the bus so that's where I'm going.   Triage ended here. 2. SEVERITY: "How bad is the cough today?"      It's better than it was 10 days ago     I'm coughing up some phlegm. 3. SPUTUM: "Describe the color of your sputum" (none, dry cough; clear, white, yellow, green)     Not asked since just talked with another triage nurse just now. 4. HEMOPTYSIS: "Are you coughing up any blood?" If so ask: "How much?" (flecks, streaks, tablespoons, etc.)      5. DIFFICULTY BREATHING: "Are you having difficulty breathing?" If Yes, ask: "How bad is it?" (e.g., mild, moderate, severe)    - MILD: No SOB at rest, mild SOB with walking, speaks normally in sentences, can lie down, no retractions, pulse < 100.    - MODERATE: SOB at rest, SOB with minimal exertion and prefers to sit, cannot lie down flat, speaks in phrases, mild retractions, audible wheezing, pulse 100-120.    - SEVERE: Very SOB at rest, speaks in single words, struggling to breathe, sitting hunched forward, retractions, pulse > 120       6. FEVER: "Do you have a fever?" If Yes, ask: "What is your temperature, how was it measured, and when did it start?"      7. CARDIAC HISTORY: "Do you have any history of heart disease?" (e.g., heart attack, congestive heart failure)       8. LUNG HISTORY: "Do you have any history of lung disease?"  (e.g., pulmonary embolus,  asthma, emphysema)      9. PE RISK FACTORS: "Do you have a history of blood clots?" (or: recent major surgery, recent prolonged travel, bedridden)      10. OTHER SYMPTOMS: "Do you have any other symptoms?" (e.g., runny nose, wheezing, chest pain)        11. PREGNANCY: "Is there any chance you are pregnant?" "When was your last menstrual period?"        12. TRAVEL: "Have you traveled out of the country in the last month?" (e.g., travel history, exposures)  Protocols used: Cough - Acute Productive-A-AH  Chief Complaint: He called in returning a call from a triage nurse.   I talked briefly with his friend while he was on the other line with the other triage nurse. Symptoms: coughing and other URI symptoms.    He was instructed to go to the [facility] which he said he was going now. Frequency: N/A Pertinent Negatives: Patient denies N/A Disposition: [] ED /[] Urgent Care (no appt availability in office) / [] Appointment(In office/virtual)/ []  [facility]/ [] Home Care/ [] Refused Recommended Disposition /[x] [facility]/ []  Follow-up with PCP Additional Notes: No triage done since just triaged by another triage nurse just now.

## 2022-07-16 NOTE — Telephone Encounter (Signed)
Summary: cough and runny nose   Pt has cough and runny nose and wanted appt / no appt until next week / please advise

## 2022-07-16 NOTE — Patient Instructions (Signed)
You are going to take azithromycin as directed, and I sent a prescription for Tessalon Perles to help you with your cough, you can use these twice a day.  I started a referral for you to be seen by gastroenterology for further evaluation of the difficulty of pill getting stuck.  I hope that you feel better soon, please let us know if there is anything else we can do for you.  Kennieth Rad, PA-C Physician Assistant [facility] [email or web address]   Dysphagia  Dysphagia is trouble swallowing. This condition occurs when solids and liquids stick in a person's throat on the way down to the stomach, or when food takes longer to get to the stomach than usual. You may have problems swallowing food, liquids, or both. You may also have pain while trying to swallow. It may take you more time and effort to swallow something. What are the causes? This condition may be caused by: Muscle problems. These may make it difficult for you to move food and liquids through the esophagus, which is the tube that connects your mouth to your stomach. Blockages. You may have ulcers, scar tissue, or inflammation that blocks the normal passage of food and liquids. Causes of these problems include: Acid reflux from your stomach into your esophagus (gastroesophageal reflux). Infections. Radiation treatment for cancer. Medicines taken without enough fluids to wash them down into your stomach. Stroke. This can affect the nerves and make it difficult to swallow. Nerve problems. These prevent signals from being sent to the muscles of your esophagus to squeeze (contract) and move what you swallow down to your stomach. Globus pharyngeus. This is a common problem that involves a feeling like something is stuck in your throat or a sense of trouble with swallowing, even though nothing is wrong with the swallowing passages. Certain conditions, such as cerebral palsy or  Parkinson's disease. What are the signs or symptoms? Common symptoms of this condition include: A feeling that solids or liquids are stuck in your throat on the way down to the stomach. Pain while swallowing. Coughing or gagging while trying to swallow. Other symptoms include: Food moving back from your stomach to your mouth (regurgitation). Noises coming from your throat. Chest discomfort when swallowing. A feeling of fullness when swallowing. Drooling, especially when the throat is blocked. Heartburn. How is this diagnosed? This condition may be diagnosed by: Barium swallow X-ray. In this test, you will swallow a white liquid that sticks to the inside of your esophagus. X-ray images are then taken. Endoscopy. In this test, a flexible telescope is inserted down your throat to look at your esophagus and your stomach. CT scans or an MRI. How is this treated? Treatment for dysphagia depends on the cause of this condition: If the dysphagia is caused by acid reflux or infection, medicines may be used. These may include antibiotics or heartburn medicines. If the dysphagia is caused by problems with the muscles, swallowing therapy may be used to help you strengthen your swallowing muscles. You may have to do specific exercises to strengthen the muscles or stretch them. If the dysphagia is caused by a blockage or mass, procedures to remove the blockage may be done. You may need surgery and a feeding tube. You may need to make diet changes. Ask your health care provider for specific instructions. Follow these instructions at home: Medicines Take over-the-counter and prescription medicines only as told by your health care provider. If you were prescribed an antibiotic medicine, take it as  told by your health care provider. Do not stop taking the antibiotic even if you start to feel better. Eating and drinking  Make any diet changes as told by your health care provider. Work with a diet and  nutrition specialist (dietitian) to create an eating plan that will help you get the nutrients you need in order to stay healthy. Eat soft foods that are easier to swallow. Cut your food into small pieces and eat slowly. Take small bites. Eat and drink only when you are sitting upright. Do not drink alcohol or caffeine. If you need help quitting, ask your health care provider. General instructions Check your weight every day to make sure you are not losing weight. Do not use any products that contain nicotine or tobacco. These products include cigarettes, chewing tobacco, and vaping devices, such as e-cigarettes. If you need help quitting, ask your health care provider. Keep all follow-up visits. This is important. Contact a health care provider if: You lose weight because you cannot swallow. You cough when you drink liquids. You cough up partially digested food. Get help right away if: You cannot swallow your saliva. You have shortness of breath, a fever, or both. Your voice is hoarse and you have trouble swallowing. These symptoms may represent a serious problem that is an emergency. Do not wait to see if the symptoms will go away. Get medical help right away. Call your local emergency services (911 in the [place] Do not drive yourself to the hospital. Summary Dysphagia is trouble swallowing. This condition occurs when solids and liquids stick in a person's throat on the way down to the stomach. You may cough or gag while trying to swallow. Dysphagia has many possible causes. Treatment for dysphagia depends on the cause of the condition. Keep all follow-up visits. This is important. This information is not intended to replace advice given to you by your health care provider. Make sure you discuss any questions you have with your health care provider. Document Revised: 02/19/2020 Document Reviewed: 02/19/2020 Elsevier Patient Education  [place].

## 2022-07-16 NOTE — Telephone Encounter (Signed)
Summary: cough and runny nose   Pt has cough and runny nose and wanted appt / no appt until next week / please advise     Attempted to call patient regarding symptoms- left message to call office

## 2022-07-17 ENCOUNTER — Ambulatory Visit: Payer: Self-pay

## 2022-07-17 NOTE — Telephone Encounter (Signed)
Message from Sharene Skeans sent at 07/17/2022  8:09 AM EST  Summary: vertigo   Pt had vertigo episode last night and asked for refill of medication for it / pts friend didn't know the name of the medication/ please advise         Chief Complaint: vertigo Symptoms: must hold onto objects to maintain balance, nasal congestion, sporadic coughing episodes, green phlegm, subjective fever Frequency: began 0200 this am  Pertinent Negatives: Patient denies headache, weakness, numbness, vomiting, earache, wheezing, SOB- cold sx started on Christmas Disposition: [] ED /[] Urgent Care (no appt availability in office) / [x] Appointment(In office/virtual)/ []  [place] [facility]/ [] Home Care/ [] Refused Recommended Disposition /[] [place] [facility]/ []  Follow-up with PCP Additional Notes: pt requesting refill of Meclizine Pt given appt for Friday.   Reason for Disposition  [1] MODERATE dizziness (e.g., vertigo; feels very unsteady, interferes with normal activities) AND [2] has been evaluated by doctor (or NP/PA) for this  Answer Assessment - Initial Assessment Questions 1. DESCRIPTION: "Describe your dizziness."     N/a 2. VERTIGO: "Do you feel like either you or the room is spinning or tilting?"      yes 3. LIGHTHEADED: "Do you feel lightheaded?" (e.g., somewhat faint, woozy, weak upon standing)     no 4. SEVERITY: "How bad is it?"  "Can you walk?"   - MILD: Feels slightly dizzy and unsteady, but is walking normally.   - MODERATE: Feels unsteady when walking, but not falling; interferes with normal activities (e.g., school, work).   - SEVERE: Unable to walk without falling, or requires assistance to walk without falling.     moderate 5. ONSET:  "When did the dizziness begin?"     Vertigo 0200  6. AGGRAVATING FACTORS: "Does anything make it worse?" (e.g., standing, change in head position)     nothing 7. CAUSE: "What do you think is causing the dizziness?"     vertigo 8. RECURRENT  SYMPTOM: "Have you had dizziness before?" If Yes, ask: "When was the last time?" "What happened that time?"     vertigo 9. OTHER SYMPTOMS: "Do you have any other symptoms?" (e.g., headache, weakness, numbness, vomiting, earache)     Fever this am 0200- feeling warm - cold sx: nasal congestion, coughing spells, phlegm - "martian  green" 10. PREGNANCY: "Is there any chance you are pregnant?" "When was your last menstrual period?"       N/a  Protocols used: Dizziness - Vertigo-A-AH

## 2022-07-17 NOTE — Telephone Encounter (Signed)
Pt would need to be evaluated before refill, last refill 12/17/21

## 2022-07-18 DIAGNOSIS — R1319 Other dysphagia: Secondary | ICD-10-CM | POA: Insufficient documentation

## 2022-07-18 NOTE — Progress Notes (Signed)
Patient ID: Leonard Green, male    DOB: Nov 30, 1950  MRN: [id number]  CC: Vertigo  Subjective: Leonard Green is a 72 y.o. male who presents for vertigo.   His concerns today include:  07/16/2022 per triage RN note: Reason for Disposition  [1] Continuous (nonstop) coughing interferes with work or school AND [2] no improvement using cough treatment per Care Advice    Going to the [facility] now.   Just talked with another triage nurse.  Answer Assessment - Initial Assessment Questions 1. ONSET: "When did the cough begin?"      The last 2-3 days on the coughing and I'm doing better.   I swallowed a pill and it got stuck in my throat.   I've had a tracheotomy in 1952 with Polio.   I just talked with a nurse on the other line while you were talking with Leonard Green.   The other nurse told me to go to the bus so that's where I'm going.   Triage ended here. 2. SEVERITY: "How bad is the cough today?"      It's better than it was 10 days ago     I'm coughing up some phlegm. 3. SPUTUM: "Describe the color of your sputum" (none, dry cough; clear, white, yellow, green)     Not asked since just talked with another triage nurse just now. 4. HEMOPTYSIS: "Are you coughing up any blood?" If so ask: "How much?" (flecks, streaks, tablespoons, etc.)      5. DIFFICULTY BREATHING: "Are you having difficulty breathing?" If Yes, ask: "How bad is it?" (e.g., mild, moderate, severe)    - MILD: No SOB at rest, mild SOB with walking, speaks normally in sentences, can lie down, no retractions, pulse < 100.    - MODERATE: SOB at rest, SOB with minimal exertion and prefers to sit, cannot lie down flat, speaks in phrases, mild retractions, audible wheezing, pulse 100-120.    - SEVERE: Very SOB at rest, speaks in single words, struggling to breathe, sitting hunched forward, retractions, pulse > 120       6. FEVER: "Do you have a fever?" If Yes, ask: "What is your temperature, how was it measured, and when  did it start?"      7. CARDIAC HISTORY: "Do you have any history of heart disease?" (e.g., heart attack, congestive heart failure)       8. LUNG HISTORY: "Do you have any history of lung disease?"  (e.g., pulmonary embolus, asthma, emphysema)      9. PE RISK FACTORS: "Do you have a history of blood clots?" (or: recent major surgery, recent prolonged travel, bedridden)      10. OTHER SYMPTOMS: "Do you have any other symptoms?" (e.g., runny nose, wheezing, chest pain)        11. PREGNANCY: "Is there any chance you are pregnant?" "When was your last menstrual period?"        12. TRAVEL: "Have you traveled out of the country in the last month?" (e.g., travel history, exposures)  Protocols used: Cough - Acute Productive-A-AH  Chief Complaint: He called in returning a call from a triage nurse.   I talked briefly with his friend while he was on the other line with the other triage nurse. Symptoms: coughing and other URI symptoms.    He was instructed to go to the [facility] which he said he was going now. Frequency: N/A Pertinent Negatives: Patient denies N/A Disposition: [] ED /[]   Urgent Care (no appt availability in office) / [] Appointment(In office/virtual)/ []  [place] [facility]/ [] Home Care/ [] Refused Recommended Disposition /[x] [place] [facility]/ []  Follow-up with PCP Additional Notes: No triage done since just triaged by another triage nurse just now.          Summary: cough and runny nose     Pt has cough and runny nose and wanted appt / no appt until next week / please advise           07/17/2022 per triage RN note: Summary: vertigo     Pt had vertigo episode last night and asked for refill of medication for it / pts friend didn't know the name of the medication/ please advise          Chief Complaint: vertigo Symptoms: must hold onto objects to maintain balance, nasal congestion, sporadic coughing episodes, green phlegm, subjective fever Frequency:  began 0200 this am  Pertinent Negatives: Patient denies headache, weakness, numbness, vomiting, earache, wheezing, SOB- cold sx started on Christmas Disposition: [] ED /[] Urgent Care (no appt availability in office) / [x] Appointment(In office/virtual)/ []  [place] [facility]/ [] Home Care/ [] Refused Recommended Disposition /[] [place] [facility]/ []  Follow-up with PCP Additional Notes: pt requesting refill of Meclizine Pt given appt for Friday.    Reason for Disposition  [1] MODERATE dizziness (e.g., vertigo; feels very unsteady, interferes with normal activities) AND [2] has been evaluated by doctor (or NP/PA) for this  Answer Assessment - Initial Assessment Questions 1. DESCRIPTION: "Describe your dizziness."     N/a 2. VERTIGO: "Do you feel like either you or the room is spinning or tilting?"      yes 3. LIGHTHEADED: "Do you feel lightheaded?" (e.g., somewhat faint, woozy, weak upon standing)     no 4. SEVERITY: "How bad is it?"  "Can you walk?"   - MILD: Feels slightly dizzy and unsteady, but is walking normally.   - MODERATE: Feels unsteady when walking, but not falling; interferes with normal activities (e.g., school, work).   - SEVERE: Unable to walk without falling, or requires assistance to walk without falling.     moderate 5. ONSET:  "When did the dizziness begin?"     Vertigo 0200  6. AGGRAVATING FACTORS: "Does anything make it worse?" (e.g., standing, change in head position)     nothing 7. CAUSE: "What do you think is causing the dizziness?"     vertigo 8. RECURRENT SYMPTOM: "Have you had dizziness before?" If Yes, ask: "When was the last time?" "What happened that time?"     vertigo 9. OTHER SYMPTOMS: "Do you have any other symptoms?" (e.g., headache, weakness, numbness, vomiting, earache)     Fever this am 0200- feeling warm - cold sx: nasal congestion, coughing spells, phlegm - "martian  green" 10. PREGNANCY: "Is there any chance you are pregnant?" "When was your  last menstrual period?"       N/a  Today's visit 07/19/2022: Reports since last visit he did establish with ENT. States ENT told him to follow-up with PCP for Meclizine refills which helped in the past. Endorses stuffy nose, denies red flag symptoms.   Patient Active Problem List   Diagnosis Date Noted   Other dysphagia 07/18/2022   Hyperlipidemia 03/07/2022   Prediabetes 03/07/2022   Spondylosis without myelopathy or radiculopathy, cervical region 02/19/2022   Fracture of femoral neck, left ([place]) 05/21/2011     Current Outpatient Medications on File Prior to Visit  Medication Sig Dispense Refill   valsartan (  DIOVAN) 160 MG tablet Take 1 tablet (160 mg total) by mouth daily. 90 tablet 3   azithromycin (ZITHROMAX) 250 MG tablet Take 2 tabs PO day 1, then take 1 tab PO once daily (Patient not taking: Reported on 07/19/2022) 6 tablet 0   benzonatate (TESSALON) 100 MG capsule Take 1 capsule (100 mg total) by mouth 2 (two) times daily as needed for cough. (Patient not taking: Reported on 07/19/2022) 20 capsule 0   rosuvastatin (CRESTOR) 20 MG tablet Take 1 tablet (20 mg total) by mouth daily. (Patient not taking: Reported on 07/19/2022) 90 tablet 1   No current facility-administered medications on file prior to visit.    No Known Allergies  Social History   Socioeconomic History   Marital status: Single    Spouse name: Not on file   Number of children: Not on file   Years of education: Not on file   Highest education level: Not on file  Occupational History   Not on file  Tobacco Use   Smoking status: Never    Passive exposure: Never   Smokeless tobacco: Never  Vaping Use   Vaping Use: Never used  Substance and Sexual Activity   Alcohol use: Never   Drug use: Never   Sexual activity: Not on file  Other Topics Concern   Not on file  Social History Narrative   Not on file   Social Determinants of Health   Financial Resource Strain: Not on file  Food Insecurity: Not on file   Transportation Needs: Not on file  Physical Activity: Not on file  Stress: Not on file  Social Connections: Not on file  Intimate Partner Violence: Not on file    Family History  Problem Relation Age of Onset   Cancer Mother    Asthma Father    Cancer Father     Past Surgical History:  Procedure Laterality Date   JOINT REPLACEMENT     hip    ROS: Review of Systems Negative except as stated above  PHYSICAL EXAM: BP 129/77   Pulse 68   Wt 148 lb 9.6 oz (67.4 kg)   SpO2 97%   BMI 24.73 kg/m   Physical Exam HENT:     Head: Normocephalic and atraumatic.  Eyes:     Extraocular Movements: Extraocular movements intact.     Conjunctiva/sclera: Conjunctivae normal.     Pupils: Pupils are equal, round, and reactive to light.  Cardiovascular:     Rate and Rhythm: Normal rate and regular rhythm.     Pulses: Normal pulses.     Heart sounds: Normal heart sounds.  Pulmonary:     Effort: Pulmonary effort is normal.     Breath sounds: Normal breath sounds.  Musculoskeletal:     Cervical back: Normal range of motion and neck supple.  Neurological:     General: No focal deficit present.     Mental Status: He is alert and oriented to person, place, and time.  Psychiatric:        Mood and Affect: Mood normal.        Behavior: Behavior normal.     ASSESSMENT AND PLAN: 1. Vertigo - Continue Meclizine as prescribed.  - Keep all scheduled appointments with ENT.  - Follow-up with primary provider as scheduled.  - meclizine (ANTIVERT) 12.5 MG tablet; Take 1 tablet (12.5 mg total) by mouth 3 (three) times daily as needed for dizziness.  Dispense: 30 tablet; Refill: 2  2. Stuffy nose - Respiratory panel  result pending.  - COVID-19, Flu A+B and RSV   Patient was given the opportunity to ask questions.  Patient verbalized understanding of the plan and was able to repeat key elements of the plan. Patient was given clear instructions to go to Emergency Department or return to  medical center if symptoms don't improve, worsen, or new problems develop.The patient verbalized understanding.   Orders Placed This Encounter  Procedures   COVID-19, Flu A+B and RSV     Requested Prescriptions   Signed Prescriptions Disp Refills   meclizine (ANTIVERT) 12.5 MG tablet 30 tablet 2    Sig: Take 1 tablet (12.5 mg total) by mouth 3 (three) times daily as needed for dizziness.    Follow-up with primary provider as scheduled.   Camillia Herter, NP

## 2022-07-19 ENCOUNTER — Encounter: Payer: Self-pay | Admitting: Family

## 2022-07-19 ENCOUNTER — Ambulatory Visit (INDEPENDENT_AMBULATORY_CARE_PROVIDER_SITE_OTHER): Payer: 59 | Admitting: Family

## 2022-07-19 VITALS — BP 129/77 | HR 68 | Wt 148.6 lb

## 2022-07-19 DIAGNOSIS — R0981 Nasal congestion: Secondary | ICD-10-CM | POA: Diagnosis not present

## 2022-07-19 DIAGNOSIS — R42 Dizziness and giddiness: Secondary | ICD-10-CM

## 2022-07-19 MED ORDER — MECLIZINE HCL 12.5 MG PO TABS: 12.5000 mg | ORAL_TABLET | Freq: Three times a day (TID) | ORAL | 2 refills | Status: DC | PRN

## 2022-07-21 LAB — COVID-19, FLU A+B AND RSV
Influenza A, NAA: NOT DETECTED
Influenza B, NAA: NOT DETECTED
RSV, NAA: NOT DETECTED
SARS-CoV-2, NAA: NOT DETECTED

## 2022-07-22 ENCOUNTER — Telehealth: Payer: Self-pay

## 2022-07-22 NOTE — Telephone Encounter (Signed)
Call patient with update. Recommendation to report to the emergency department for evaluation/management.

## 2022-07-22 NOTE — Telephone Encounter (Signed)
Pt was called and is aware of results, DOB was confirmed.  ?

## 2022-07-22 NOTE — Telephone Encounter (Signed)
Patient stated that he is still having problem with dizziness

## 2022-07-22 NOTE — Telephone Encounter (Signed)
-----   Message from Camillia Herter, NP sent at 07/22/2022  7:54 AM EST ----- Call patient with update. Covid, flu, RSV negative.

## 2022-07-24 IMAGING — CT CT HEAD CODE STROKE
3 series · 16 of 47 positions shown, 19 images · non-contrast
Comparison: CT head 08/06/2007.

CLINICAL DATA: Code stroke.  Neuro deficit, acute, stroke suspected



[Series 2: head 5.0 st · axial · 0.41mm/px · z∈[-72,+63]mm · 10 of 33 slices shown, 13 images]
[im 3/33  brain]
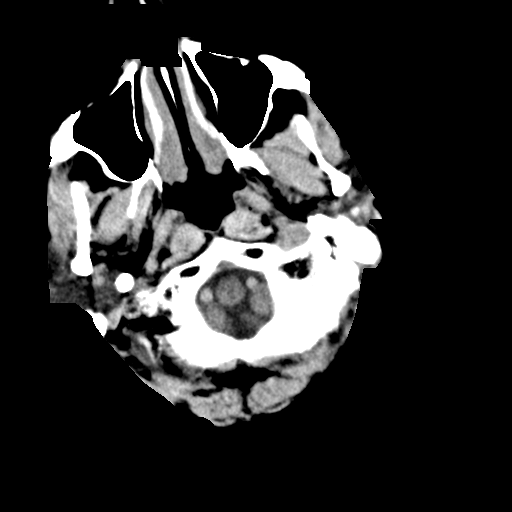
[im 3/33  bone]
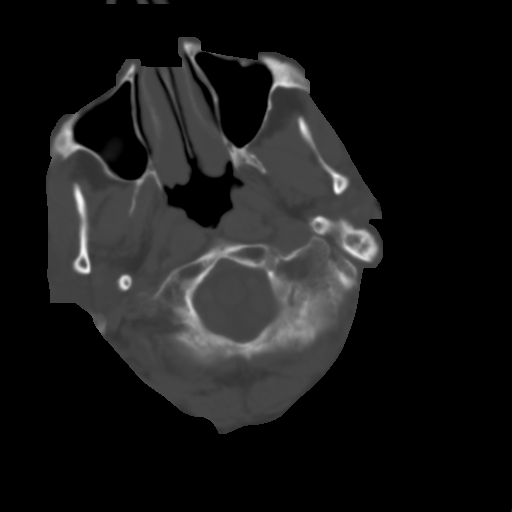
[im 6/33  brain]
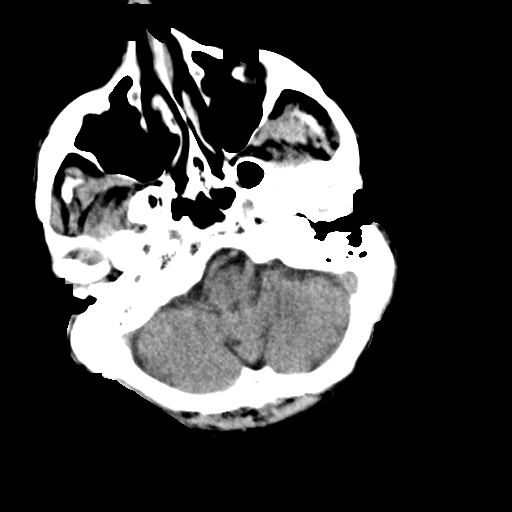
[im 9/33  brain]
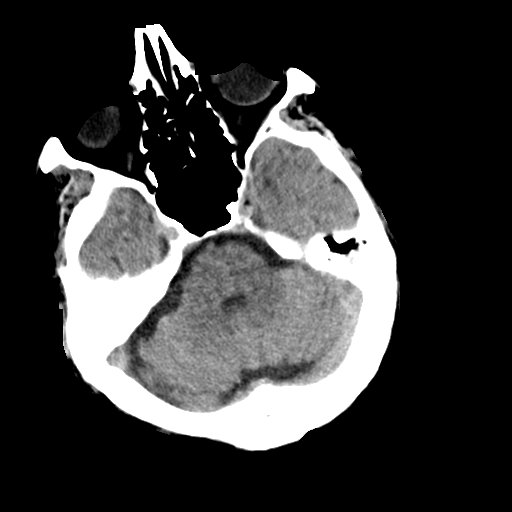
[im 12/33  brain]
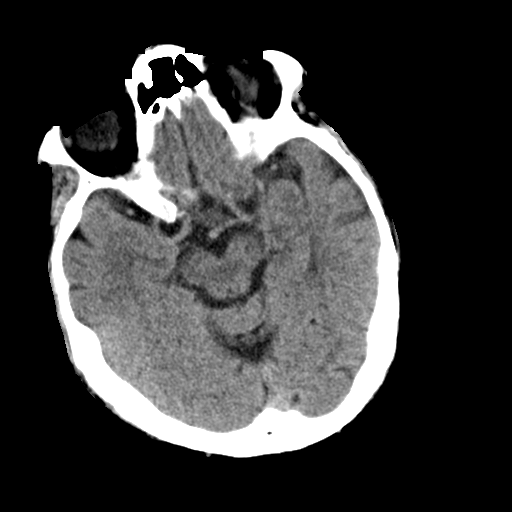
[im 15/33  brain]
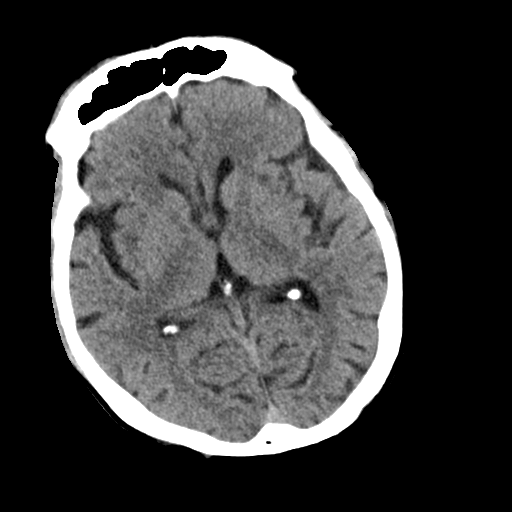
[im 15/33  bone]
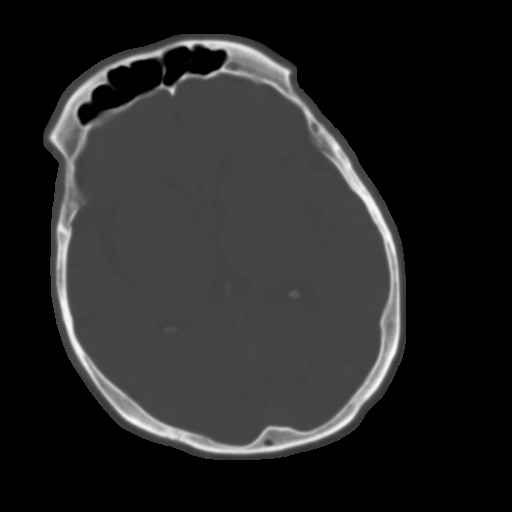
[im 18/33  brain]
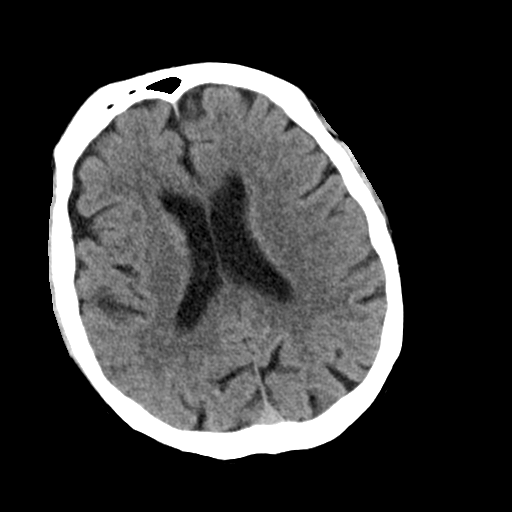
[im 21/33  brain]
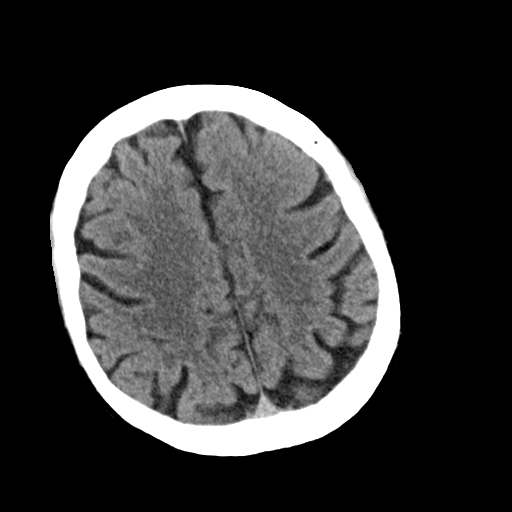
[im 25/33  brain]
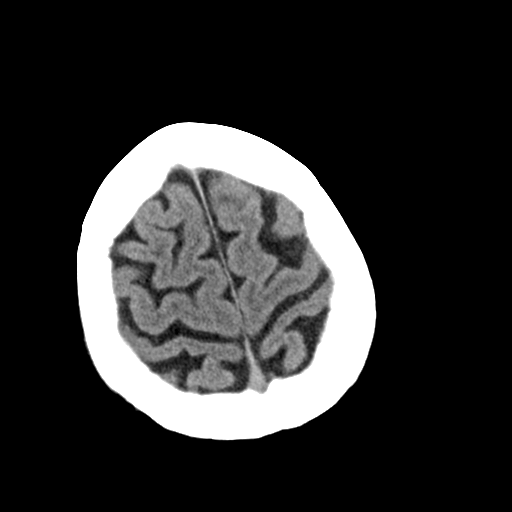
[im 27/33  brain]
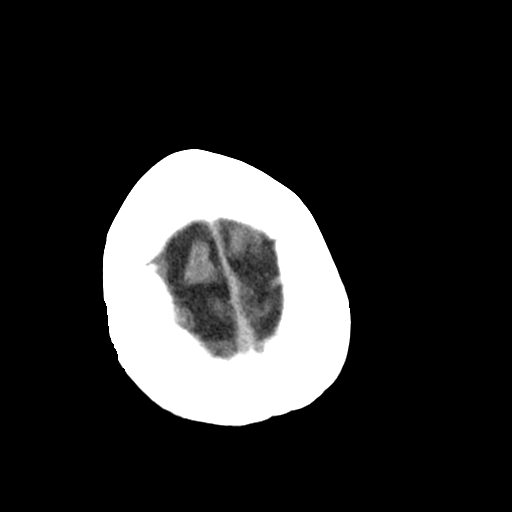
[im 27/33  bone]
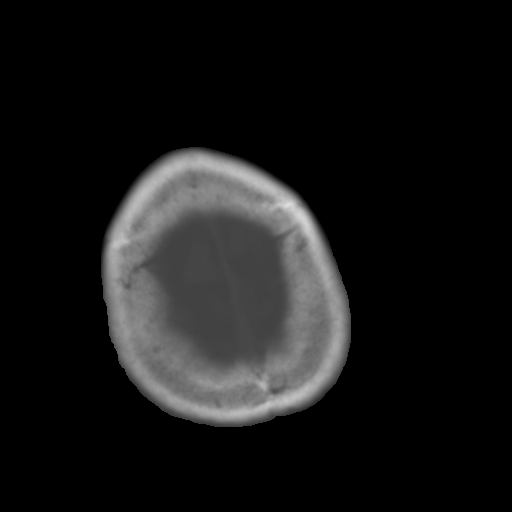
[im 30/33  brain]
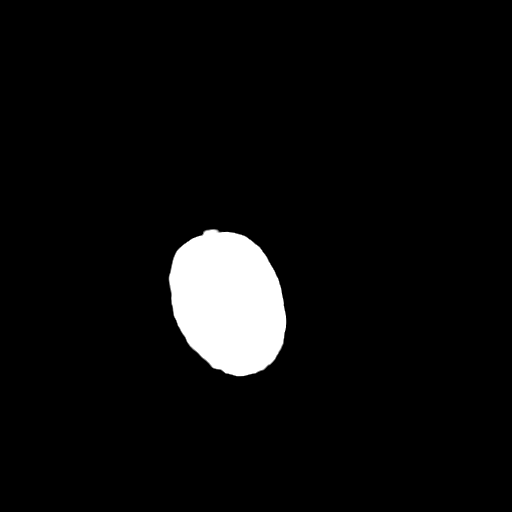

[Series 5: head 3.0 cor st · coronal · 0.33mm/px · 3 of 71 slices shown]
[im 24/71  brain]
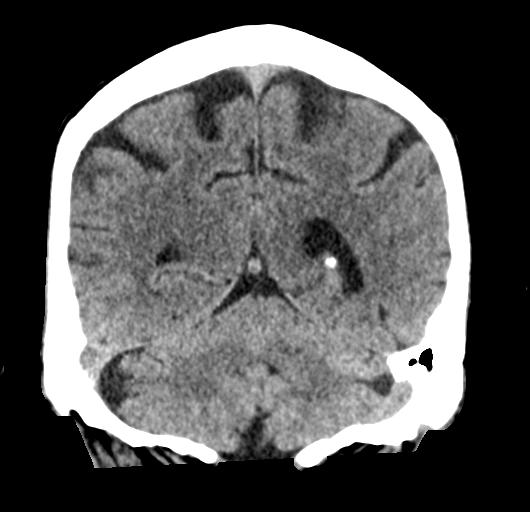
[im 32/71  brain]
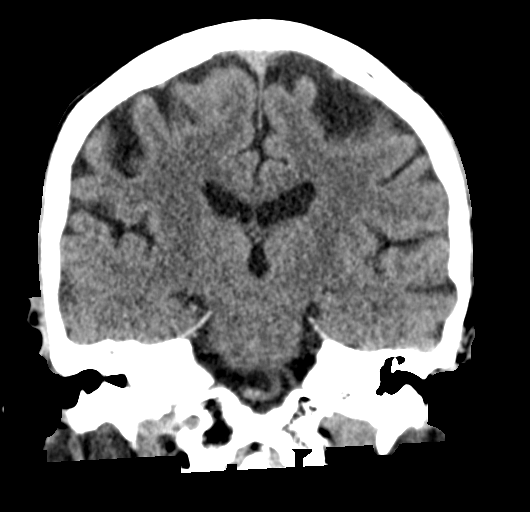
[im 39/71  brain]
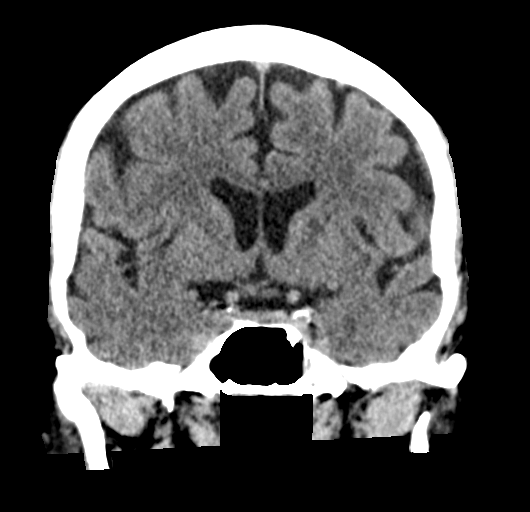

[Series 6: head 3.0 sag st · sagittal · 0.36mm/px · 3 of 59 slices shown]
[im 20/59  brain]
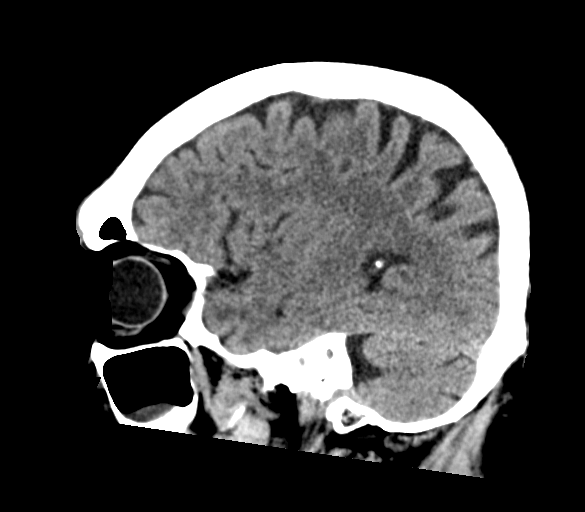
[im 30/59  brain]
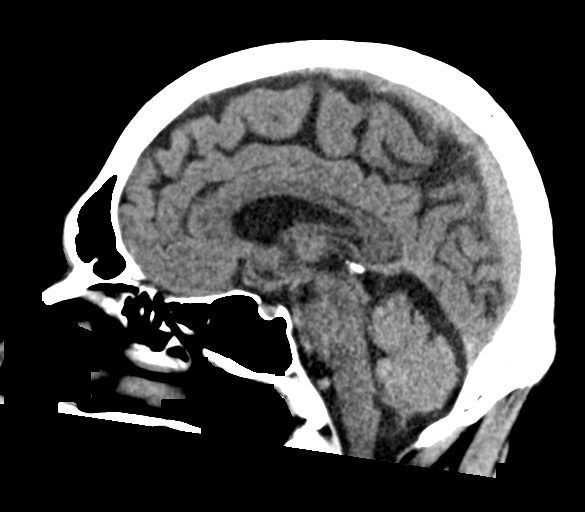
[im 39/59  brain]
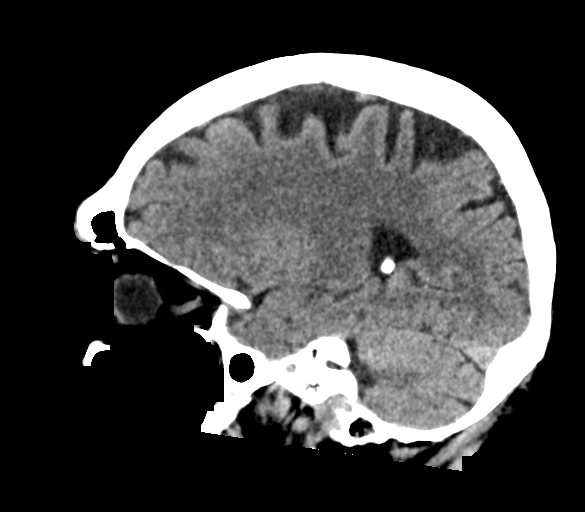

[16 of 47 positions shown; findings below may reference images not displayed]

FINDINGS: Brain: No evidence of acute infarction, hemorrhage, hydrocephalus,
extra-axial collection or mass lesion/mass effect.

Vascular: No hyperdense vessel identified.

Skull: No acute fracture.

Sinuses/Orbits: Inferior maxillary sinus paranasal sinus mucosal
thickening.

Other: Right larger than left mastoid effusions.

ASPECTS ([place] Stroke Program Early CT Score) total score (0-10
with 10 being normal): 10.
IMPRESSION: 1. No evidence of acute intracranial abnormality. ASPECTS is 10.
2. Chronic right mastoid effusion.

Code stroke imaging results were communicated on 12/27/2021 at [DATE] to provider [place] via secure text paging.

## 2022-07-26 ENCOUNTER — Other Ambulatory Visit: Payer: Self-pay | Admitting: Family

## 2022-07-26 DIAGNOSIS — R42 Dizziness and giddiness: Secondary | ICD-10-CM

## 2022-07-26 MED ORDER — MECLIZINE HCL 25 MG PO TABS: 25.0000 mg | ORAL_TABLET | Freq: Three times a day (TID) | ORAL | 1 refills | Status: DC | PRN

## 2022-07-26 NOTE — Telephone Encounter (Signed)
Order complete. 

## 2022-07-26 NOTE — Telephone Encounter (Signed)
Called patient and he is aware. 

## 2022-07-26 NOTE — Telephone Encounter (Signed)
Called patient and he is aware of doc note. Patient has some hesitation when ash about going to the hospital wanting to know if you will just up the dosage, stated he had a higher dose the last

## 2022-08-02 ENCOUNTER — Ambulatory Visit (HOSPITAL_COMMUNITY)
Admission: EM | Admit: 2022-08-02 | Discharge: 2022-08-02 | Disposition: A | Discharge status: Home / Self Care | Payer: 59 | Attending: Family Medicine | Admitting: Family Medicine

## 2022-08-02 ENCOUNTER — Encounter (HOSPITAL_COMMUNITY): Payer: Self-pay | Admitting: Emergency Medicine

## 2022-08-02 ENCOUNTER — Encounter: Payer: Self-pay | Admitting: Gastroenterology

## 2022-08-02 DIAGNOSIS — R42 Dizziness and giddiness: Secondary | ICD-10-CM | POA: Diagnosis not present

## 2022-08-02 MED ORDER — LORAZEPAM 0.5 MG PO TABS: 0.5000 mg | ORAL_TABLET | Freq: Three times a day (TID) | ORAL | 0 refills | Status: DC | PRN

## 2022-08-02 NOTE — ED Triage Notes (Signed)
Reports vertigo x 2 weeks, no improvement on the medication he was previously prescribed. Currently taking 25 mg of Meclizine. Describes it as a "wobbly feeling when I'm walking." Denies pain.

## 2022-08-02 NOTE — ED Provider Notes (Signed)
[place]    CSN: [id number] Arrival date & time: 08/02/22  3546      History   Chief Complaint Chief Complaint  Patient presents with   Dizziness    HPI Leonard Green is a 72 y.o. male.    Dizziness  Here for vertigo that is been going on for about 2 weeks.  He was prescribed meclizine 12.5 and then 25 mg.  Neither dose has been effective.  He has been taking it 3 times a day.  No cough or congestion or fever.  No ear pain.  Past Medical History:  Diagnosis Date   Hypertension     Patient Active Problem List   Diagnosis Date Noted   Other dysphagia 07/18/2022   Hyperlipidemia 03/07/2022   Prediabetes 03/07/2022   Spondylosis without myelopathy or radiculopathy, cervical region 02/19/2022   Fracture of femoral neck, left ([place]) 05/21/2011    Past Surgical History:  Procedure Laterality Date   JOINT REPLACEMENT     hip       Home Medications    Prior to Admission medications   Medication Sig Start Date End Date Taking? Authorizing Provider  LORazepam (ATIVAN) 0.5 MG tablet Take 1 tablet (0.5 mg total) by mouth every 8 (eight) hours as needed (vertigo). 08/02/22  Yes Barrett Henle, MD  rosuvastatin (CRESTOR) 20 MG tablet Take 1 tablet (20 mg total) by mouth daily. Patient not taking: Reported on 07/19/2022 05/29/22   Croitoru, Mihai, MD  valsartan (DIOVAN) 160 MG tablet Take 1 tablet (160 mg total) by mouth daily. 04/05/22   Camillia Herter, NP    Family History Family History  Problem Relation Age of Onset   Cancer Mother    Asthma Father    Cancer Father     Social History Social History   Tobacco Use   Smoking status: Never    Passive exposure: Never   Smokeless tobacco: Never  Vaping Use   Vaping Use: Never used  Substance Use Topics   Alcohol use: Never   Drug use: Never     Allergies   Patient has no known allergies.   Review of Systems Review of Systems  Neurological:  Positive for dizziness.     Physical  Exam Triage Vital Signs ED Triage Vitals  Enc Vitals Group     BP 08/02/22 1013 137/87     Pulse Rate 08/02/22 1013 67     Resp 08/02/22 1013 16     Temp 08/02/22 1013 98.8 F (37.1 C)     Temp Source 08/02/22 1013 Oral     SpO2 08/02/22 1013 98 %     Weight --      Height --      Head Circumference --      Peak Flow --      Pain Score 08/02/22 1014 0     Pain Loc --      Pain Edu? --      Excl. in [place]? --    No data found.  Updated Vital Signs BP 137/87 (BP Location: Right Arm)   Pulse 67   Temp 98.8 F (37.1 C) (Oral)   Resp 16   SpO2 98%   Visual Acuity Right Eye Distance:   Left Eye Distance:   Bilateral Distance:    Right Eye Near:   Left Eye Near:    Bilateral Near:     Physical Exam Vitals reviewed.  Constitutional:      General: He  is not in acute distress.    Appearance: He is not ill-appearing, toxic-appearing or diaphoretic.  HENT:     Right Ear: Tympanic membrane and ear canal normal.     Left Ear: Tympanic membrane and ear canal normal.     Nose: Nose normal.     Mouth/Throat:     Mouth: Mucous membranes are moist.     Pharynx: No oropharyngeal exudate or posterior oropharyngeal erythema.  Eyes:     Pupils: Pupils are equal, round, and reactive to light.     Comments: There is a beat of nystagmus to the left.  Cardiovascular:     Rate and Rhythm: Normal rate and regular rhythm.     Heart sounds: No murmur heard. Pulmonary:     Effort: Pulmonary effort is normal.     Breath sounds: Normal breath sounds.  Musculoskeletal:     Cervical back: Neck supple.  Lymphadenopathy:     Cervical: No cervical adenopathy.  Skin:    Coloration: Skin is not pale.  Neurological:     General: No focal deficit present.     Mental Status: He is alert and oriented to person, place, and time.  Psychiatric:        Behavior: Behavior normal.      UC Treatments / Results  Labs (all labs ordered are listed, but only abnormal results are displayed) Labs  Reviewed - No data to display  EKG   Radiology No results found.  Procedures Procedures (including critical care time)  Medications Ordered in UC Medications - No data to display  Initial Impression / Assessment and Plan / UC Course  I have reviewed the triage vital signs and the nursing notes.  Pertinent labs & imaging results that were available during my care of the patient were reviewed by me and considered in my medical decision making (see chart for details).        He has nothing on PMP.  I am sending a low-dose Valium for him to try as needed and Epley maneuver is described in his discharge paperwork.  I have asked him to follow-up with his primary care, in case he needs neurovestibular therapy to improve. Final Clinical Impressions(s) / UC Diagnoses   Final diagnoses:  Vertigo     Discharge Instructions      Lorazepam 0.5 mg--1 tablet by mouth every 8 hours as needed for vertigo.  This medication can make you dizzy or sleepy.  You may want to try half of a tablet first.  Stop taking the meclizine since it is not helping  Please call your primary care for a follow-up appointment about this issue     ED Prescriptions     Medication Sig Dispense Auth. Provider   LORazepam (ATIVAN) 0.5 MG tablet Take 1 tablet (0.5 mg total) by mouth every 8 (eight) hours as needed (vertigo). 15 tablet Juleah Paradise, Gwenlyn Perking, MD      I have reviewed the PDMP during this encounter.   Barrett Henle, MD 08/02/22 [phone]

## 2022-08-02 NOTE — Discharge Instructions (Signed)
Lorazepam 0.5 mg--1 tablet by mouth every 8 hours as needed for vertigo.  This medication can make you dizzy or sleepy.  You may want to try half of a tablet first.  Stop taking the meclizine since it is not helping  Please call your primary care for a follow-up appointment about this issue

## 2022-08-07 ENCOUNTER — Ambulatory Visit: Payer: Self-pay

## 2022-08-07 NOTE — Telephone Encounter (Signed)
Patient is returning call- he is requesting ENT referral. Patient states he has been on 2 different medications that are not working. Patient has a little frustration at not getting relief. Does provider have any other suggestions for treatment. Patient only has 2 pills left- lorazepam. He will need Rx

## 2022-08-07 NOTE — Telephone Encounter (Signed)
Appt scheduled 01/29

## 2022-08-07 NOTE — Telephone Encounter (Signed)
Summary: Vertigo   Leonard Green is calling on behalf of the patient stated the medication he was given for vertigo is not helping.  Please call pt back directly. Leonard Green is not with patient.  Callback- [phone] or pt's roommate Leonard Green  [phone]     Chief Complaint: Vertigo, asking for a referral to ENT. Symptoms: Dizzy Frequency: Early Pertinent Negatives: Patient denies  Disposition: [] ED /[] Urgent Care (no appt availability in office) / [] Appointment(In office/virtual)/ []  [facility]/ [] Home Care/ [] Refused Recommended Disposition /[] [facility]/ [x]  Follow-up with PCP Additional Notes: Please advise pt.  Answer Assessment - Initial Assessment Questions 1. DESCRIPTION: "Describe your dizziness."     Dizzy 2. VERTIGO: "Do you feel like either you or the room is spinning or tilting?"      Yes 3. LIGHTHEADED: "Do you feel lightheaded?" (e.g., somewhat faint, woozy, weak upon standing)     YES 4. SEVERITY: "How bad is it?"  "Can you walk?"   - MILD: Feels slightly dizzy and unsteady, but is walking normally.   - MODERATE: Feels unsteady when walking, but not falling; interferes with normal activities (e.g., school, work).   - SEVERE: Unable to walk without falling, or requires assistance to walk without falling.     Moderate 5. ONSET:  "When did the dizziness begin?"     JAN 6. AGGRAVATING FACTORS: "Does anything make it worse?" (e.g., standing, change in head position)     Head movements 7. CAUSE: "What do you think is causing the dizziness?"     Unsure 8. RECURRENT SYMPTOM: "Have you had dizziness before?" If Yes, ask: "When was the last time?" "What happened that time?"     Yes 9. OTHER SYMPTOMS: "Do you have any other symptoms?" (e.g., headache, weakness, numbness, vomiting, earache)     N/a 10. PREGNANCY: "Is there any chance you are pregnant?" "When was your last menstrual period?"       N/a  Protocols used: Dizziness - Vertigo-A-AH

## 2022-08-07 NOTE — Telephone Encounter (Signed)
Pt would need to schedule appt for further evaluation

## 2022-08-09 NOTE — Progress Notes (Unsigned)
Patient ID: Leonard Green, male    DOB: Nov 18, 1950  MRN: [id number]  CC: Referral   Subjective: Leonard Green is a 72 y.o. male who presents for referral.   His concerns today include:  ENT referral  07/19/2022 visit: Reports since last visit he did establish with ENT. States ENT told him to follow-up with PCP for Meclizine refills which helped in the past. Endorses stuffy nose, denies red flag symptoms.      Patient Active Problem List   Diagnosis Date Noted   Other dysphagia 07/18/2022   Hyperlipidemia 03/07/2022   Prediabetes 03/07/2022   Spondylosis without myelopathy or radiculopathy, cervical region 02/19/2022   Fracture of femoral neck, left ([place]) 05/21/2011     Current Outpatient Medications on File Prior to Visit  Medication Sig Dispense Refill   LORazepam (ATIVAN) 0.5 MG tablet Take 1 tablet (0.5 mg total) by mouth every 8 (eight) hours as needed (vertigo). 15 tablet 0   rosuvastatin (CRESTOR) 20 MG tablet Take 1 tablet (20 mg total) by mouth daily. (Patient not taking: Reported on 07/19/2022) 90 tablet 1   valsartan (DIOVAN) 160 MG tablet Take 1 tablet (160 mg total) by mouth daily. 90 tablet 3   No current facility-administered medications on file prior to visit.    No Known Allergies  Social History   Socioeconomic History   Marital status: Single    Spouse name: Not on file   Number of children: Not on file   Years of education: Not on file   Highest education level: Not on file  Occupational History   Not on file  Tobacco Use   Smoking status: Never    Passive exposure: Never   Smokeless tobacco: Never  Vaping Use   Vaping Use: Never used  Substance and Sexual Activity   Alcohol use: Never   Drug use: Never   Sexual activity: Not on file  Other Topics Concern   Not on file  Social History Narrative   Not on file   Social Determinants of Health   Financial Resource Strain: Not on file  Food Insecurity: Not on file  Transportation Needs:  Not on file  Physical Activity: Not on file  Stress: Not on file  Social Connections: Not on file  Intimate Partner Violence: Not on file    Family History  Problem Relation Age of Onset   Cancer Mother    Asthma Father    Cancer Father     Past Surgical History:  Procedure Laterality Date   JOINT REPLACEMENT     hip    ROS: Review of Systems Negative except as stated above  PHYSICAL EXAM: There were no vitals taken for this visit.  Physical Exam  {male adult master:310786} {male adult master:310785}     Latest Ref Rng & Units 04/05/2022    9:03 AM 12/27/2021    1:46 PM 12/27/2021    1:39 PM  CMP  Glucose 70 - 99 mg/dL 102  156  154   BUN 8 - 27 mg/dL 16  16  17    Creatinine 0.76 - 1.27 mg/dL 1.07  1.10  1.18   Sodium 134 - 144 mmol/L 142  138  139   Potassium 3.5 - 5.2 mmol/L 4.0  3.3  3.3   Chloride 96 - 106 mmol/L 104  105  104   CO2 20 - 29 mmol/L 21   20   Calcium 8.6 - 10.2 mg/dL 9.1   8.9   Total  Protein 6.5 - 8.1 g/dL   6.8   Total Bilirubin 0.3 - 1.2 mg/dL   0.6   Alkaline Phos 38 - 126 U/L   70   AST 15 - 41 U/L   18   ALT 0 - 44 U/L   11    Lipid Panel     Component Value Date/Time   CHOL 220 (H) 04/04/2022 1444   TRIG 51 04/04/2022 1444   HDL 60 04/04/2022 1444   CHOLHDL 3.7 04/04/2022 1444   LDLCALC 151 (H) 04/04/2022 1444    CBC    Component Value Date/Time   WBC 8.5 12/27/2021 1339   RBC 4.51 12/27/2021 1339   HGB 13.9 12/27/2021 1346   HCT 41.0 12/27/2021 1346   PLT 176 12/27/2021 1339   MCV 89.8 12/27/2021 1339   MCH 31.0 12/27/2021 1339   MCHC 34.6 12/27/2021 1339   RDW 13.3 12/27/2021 1339   LYMPHSABS 1.7 12/27/2021 1339   MONOABS 0.6 12/27/2021 1339   EOSABS 0.1 12/27/2021 1339   BASOSABS 0.1 12/27/2021 1339    ASSESSMENT AND PLAN:  There are no diagnoses linked to this encounter.   Patient was given the opportunity to ask questions.  Patient verbalized understanding of the plan and was able to repeat key elements  of the plan. Patient was given clear instructions to go to Emergency Department or return to medical center if symptoms don't improve, worsen, or new problems develop.The patient verbalized understanding.   No orders of the defined types were placed in this encounter.    Requested Prescriptions    No prescriptions requested or ordered in this encounter    No follow-ups on file.  Camillia Herter, NP

## 2022-08-12 ENCOUNTER — Ambulatory Visit (INDEPENDENT_AMBULATORY_CARE_PROVIDER_SITE_OTHER): Payer: 59 | Admitting: Family

## 2022-08-12 VITALS — BP 163/89 | HR 62 | Ht 65.0 in | Wt 150.8 lb

## 2022-08-12 DIAGNOSIS — R42 Dizziness and giddiness: Secondary | ICD-10-CM | POA: Diagnosis not present

## 2022-08-12 MED ORDER — MECLIZINE HCL 25 MG PO TABS: 25.0000 mg | ORAL_TABLET | Freq: Three times a day (TID) | ORAL | 2 refills | Status: DC | PRN

## 2022-08-12 NOTE — Patient Instructions (Addendum)
[facility] at [facility] [address] [place],  [place]  [address] Main: [phone]  Vertigo Vertigo is the feeling that you or the things around you are moving when they are not. This feeling can come and go at any time. Vertigo often goes away on its own. This condition can be dangerous if it happens when you are doing activities like driving or working with machines. Your doctor will do tests to find the cause of your vertigo. These tests will also help your doctor decide on the best treatment for you. Follow these instructions at home: Eating and drinking     Drink enough fluid to keep your pee (urine) pale yellow. Do not drink alcohol. Activity Return to your normal activities when your doctor says that it is safe. In the morning, first sit up on the side of the bed. When you feel okay, stand slowly while you hold onto something until you know that your balance is fine. Move slowly. Avoid sudden body or head movements or certain positions, as told by your doctor. Use a cane if you have trouble standing or walking. Sit down right away if you feel dizzy. Avoid doing any tasks or activities that can cause danger to you or others if you get dizzy. Avoid bending down if you feel dizzy. Place items in your home so that they are easy for you to reach without bending or leaning over. Do not drive or use machinery if you feel dizzy. General instructions Take over-the-counter and prescription medicines only as told by your doctor. Keep all follow-up visits. Contact a doctor if: Your medicine does not help your vertigo. Your problems get worse or you have new symptoms. You have a fever. You feel like you may vomit (nauseous), or this feeling gets worse. You start to vomit. Your family or friends see changes in how you act. You lose feeling (have numbness) in part of your body. You feel prickling and tingling in a part of your body. Get help right away  if: You are always dizzy. You faint. You get very bad headaches. You get a stiff neck. Bright light starts to bother you. You have trouble moving or talking. You feel weak in your hands, arms, or legs. You have changes in your hearing or in how you see (vision). These symptoms may be an emergency. Get help right away. Call your local emergency services (911 in the [place] Do not wait to see if the symptoms will go away. Do not drive yourself to the hospital. Summary Vertigo is the feeling that you or the things around you are moving when they are not. Your doctor will do tests to find the cause of your vertigo. You may be told to avoid some tasks, positions, or movements. Contact a doctor if your medicine is not helping, or if you have a fever, new symptoms, or a change in how you act. Get help right away if you get very bad headaches, or if you have changes in how you speak, hear, or see. This information is not intended to replace advice given to you by your health care provider. Make sure you discuss any questions you have with your health care provider. Document Revised: 05/31/2020 Document Reviewed: 05/31/2020 Elsevier Patient Education  [place].

## 2022-08-12 NOTE — Progress Notes (Signed)
Trouble swallowing.

## 2022-08-28 ENCOUNTER — Ambulatory Visit: Payer: 59 | Admitting: Gastroenterology

## 2022-09-03 DIAGNOSIS — M542 Cervicalgia: Secondary | ICD-10-CM | POA: Insufficient documentation

## 2022-09-03 DIAGNOSIS — H8110 Benign paroxysmal vertigo, unspecified ear: Secondary | ICD-10-CM | POA: Insufficient documentation

## 2022-09-03 DIAGNOSIS — G43809 Other migraine, not intractable, without status migrainosus: Secondary | ICD-10-CM | POA: Insufficient documentation

## 2022-09-06 ENCOUNTER — Telehealth: Payer: Self-pay

## 2022-09-06 NOTE — Telephone Encounter (Signed)
  Patient seen 0/02/05/204 by Janora Norlander, FNP for Vertigo     Copied from [place] [phone]. Topic: Referral - Request for Referral >> Aug 14, 2022 11:55 AM Everette C wrote: Has patient seen PCP for this complaint? Yes.   *If NO, is insurance requiring patient see PCP for this issue before PCP can refer them? Referral for which specialty: Otolaryngology  Preferred provider/office: The patient would like to be seen at a facility that can treat ears and sees adults  Reason for referral: Vertigo concerns

## 2022-09-17 ENCOUNTER — Ambulatory Visit: Payer: 59 | Attending: Nurse Practitioner | Admitting: Nurse Practitioner

## 2022-09-17 ENCOUNTER — Encounter: Payer: Self-pay | Admitting: Nurse Practitioner

## 2022-09-17 VITALS — BP 130/78 | HR 68 | Ht 65.0 in | Wt 155.0 lb

## 2022-09-17 DIAGNOSIS — E782 Mixed hyperlipidemia: Secondary | ICD-10-CM

## 2022-09-17 DIAGNOSIS — R42 Dizziness and giddiness: Secondary | ICD-10-CM | POA: Diagnosis not present

## 2022-09-17 DIAGNOSIS — R9431 Abnormal electrocardiogram [ECG] [EKG]: Secondary | ICD-10-CM | POA: Diagnosis not present

## 2022-09-17 DIAGNOSIS — I10 Essential (primary) hypertension: Secondary | ICD-10-CM

## 2022-09-17 MED ORDER — VALSARTAN 160 MG PO TABS: 160.0000 mg | ORAL_TABLET | Freq: Every day | ORAL | 3 refills | Status: DC

## 2022-09-17 NOTE — Patient Instructions (Signed)
Medication Instructions:  Stop Crestor as directed.  *If you need a refill on your cardiac medications before your next appointment, please call your pharmacy*   Lab Work: Your physician recommends that you complete lab work today. Fasting Lipid panel & CMET  If you have labs (blood work) drawn today and your tests are completely normal, you will receive your results only by: [place] (if you have MyChart) OR A paper copy in the mail If you have any lab test that is abnormal or we need to change your treatment, we will call you to review the results.   Testing/Procedures: NONE ordered at this time of appointment     Follow-Up: At [facility], you and your health needs are our priority.  As part of our continuing mission to provide you with exceptional heart care, we have created designated Provider Care Teams.  These Care Teams include your primary Cardiologist (physician) and Advanced Practice Providers (APPs -  Physician Assistants and Nurse Practitioners) who all work together to provide you with the care you need, when you need it.  We recommend signing up for the patient portal called "MyChart".  Sign up information is provided on this After Visit Summary.  MyChart is used to connect with patients for Virtual Visits (Telemedicine).  Patients are able to view lab/test results, encounter notes, upcoming appointments, etc.  Non-urgent messages can be sent to your provider as well.   To learn more about what you can do with MyChart, go to [email or web address].    Your next appointment:   6 month(s) (Dr. Loletha Grayer) 1-2 months (Pharm D)  Provider:   Sanda Klein, MD     Other Instructions

## 2022-09-17 NOTE — Progress Notes (Signed)
Office Visit    Patient Name: Leonard Green Date of Encounter: 09/17/2022  Primary Care Provider:  Camillia Herter, NP Primary Cardiologist:  Sanda Klein, MD  Chief Complaint    72 year old male with a history of QT prolongation, hypertension, hyperlipidemia, vertigo, and polio who presents for follow-up related to hypertension and hyperlipidemia.  Past Medical History    Past Medical History:  Diagnosis Date   Hypertension    Past Surgical History:  Procedure Laterality Date   JOINT REPLACEMENT     hip    Allergies  No Known Allergies   Labs/Other Studies Reviewed    The following studies were reviewed today: None  Recent Labs: 12/27/2021: ALT 11; Hemoglobin 13.9; Magnesium 2.0; Platelets 176 04/05/2022: BUN 16; Creatinine, Ser 1.07; Potassium 4.0; Sodium 142  Recent Lipid Panel    Component Value Date/Time   CHOL 220 (H) 04/04/2022 1444   TRIG 51 04/04/2022 1444   HDL 60 04/04/2022 1444   CHOLHDL 3.7 04/04/2022 1444   LDLCALC 151 (H) 04/04/2022 1444    History of Present Illness    72 year old male with the above past medical history including QT prolongation, hypertension, hyperlipidemia, vertigo, and polio.  He has a history of polio as a toddler with residual right facial palsy and left leg paresis.  He has a longstanding history of uncontrolled hypertension.  He was referred to Dr. Sallyanne Kuster in July 2023 in the setting of abnormal EKG (sinus rhythm with PACs, incomplete RBBB pattern and prolonged QTc around 500 ms).  He was last seen in the office on 01/16/2022 and was stable from a cardiac standpoint.  EKG revealed sinus bradycardia, 50 bpm, normal QT interval at 458 ms, no repolarization abnormalities no evidence of RBBB pattern.  No further workup was recommended.  Valsartan was increased to 80 mg daily.  He was started on rosuvastatin per lipid clinic Pharm.D.  Was seen in the ED on 08/02/2022 with dizziness, suspected vertigo.  He was referred to  ENT.  He presents today for follow-up.  Since his last visit he has done well from a cardiac standpoint.  He has had ongoing difficulty with vertigo.  He saw ENT but has had little improvement with meclizine, Epley maneuvers have not been helping.  He also notes a new mild left arm tremor.  He stopped taking Crestor as he felt it might have contributed to his worsening vertigo and tremor.  He has not noted any improvement in his symptoms since discontinuing Crestor however, he is not interested in resuming statin therapy at this time.  BP has been stable.  He denies symptoms concerning for angina.  Other than his ongoing vertigo, he reports feeling well.  Home Medications    Current Outpatient Medications  Medication Sig Dispense Refill   valsartan (DIOVAN) 160 MG tablet Take 1 tablet (160 mg total) by mouth daily. 90 tablet 3   No current facility-administered medications for this visit.     Review of Systems    He denies chest pain, palpitations, dyspnea, pnd, orthopnea, n, v, syncope, edema, weight gain, or early satiety. All other systems reviewed and are otherwise negative except as noted above.   Physical Exam    VS:  BP 130/78 (BP Location: Left Arm, Patient Position: Sitting, Cuff Size: Normal)   Pulse 68   Ht '5\' 5"'$  (1.651 m)   Wt 155 lb (70.3 kg)   BMI 25.79 kg/m  GEN: Well nourished, well developed, in no acute distress. HEENT: normal.  Neck: Supple, no JVD, carotid bruits, or masses. Cardiac: RRR, no murmurs, rubs, or gallops. No clubbing, cyanosis, edema.  Radials/DP/PT 2+ and equal bilaterally.  Respiratory:  Respirations regular and unlabored, clear to auscultation bilaterally. GI: Soft, nontender, nondistended, BS + x 4. MS: no deformity or atrophy. Skin: warm and dry, no rash. Neuro:  Strength and sensation are intact. Psych: Normal affect.  Accessory Clinical Findings    ECG personally reviewed by me today - No EKG in office today.    Lab Results  Component  Value Date   WBC 8.5 12/27/2021   HGB 13.9 12/27/2021   HCT 41.0 12/27/2021   MCV 89.8 12/27/2021   PLT 176 12/27/2021   Lab Results  Component Value Date   CREATININE 1.07 04/05/2022   BUN 16 04/05/2022   NA 142 04/05/2022   K 4.0 04/05/2022   CL 104 04/05/2022   CO2 21 04/05/2022   Lab Results  Component Value Date   ALT 11 12/27/2021   AST 18 12/27/2021   ALKPHOS 70 12/27/2021   BILITOT 0.6 12/27/2021   Lab Results  Component Value Date   CHOL 220 (H) 04/04/2022   HDL 60 04/04/2022   LDLCALC 151 (H) 04/04/2022   TRIG 51 04/04/2022   CHOLHDL 3.7 04/04/2022    Lab Results  Component Value Date   HGBA1C 6.0 (H) 03/06/2022    Assessment & Plan    1. History of abnormal EKG/QT prolongation: Not appreciated on most recent EKG.  No EKG in office today.  Denies any new or concerning symptoms.  Plan to repeat EKG at next follow-up visit.  2. Hypertension:  BP well controlled. Continue current antihypertensive regimen.   3. Hyperlipidemia: LDL was 151 in 03/2022.  He was started on Crestor, however, he stopped taking his medication in mid January 2024 due to concern for worsening vertigo, left arm tremor.  His symptoms did not improve with discontinuation of Crestor, however, he is reluctant to reinitiate statin therapy at this time. I will have him follow-up with lipid clinic Pharm.D. for further recommendations.  Will discontinue Crestor.  Will update fasting lipid panel, CMET.  4. Vertigo: Ongoing, following with ENT/PCP.   5. Disposition: Follow-up in 1 to 2 months with lipid clinic Pharm.D., follow-up in 6 months with Dr. Sallyanne Kuster.     Lenna Sciara, NP 09/17/2022, 9:39 AM

## 2022-09-18 LAB — COMPREHENSIVE METABOLIC PANEL
ALT: 15 IU/L (ref 0–44)
AST: 23 IU/L (ref 0–40)
Albumin/Globulin Ratio: 1.7 (ref 1.2–2.2)
Albumin: 4.2 g/dL (ref 3.8–4.8)
Alkaline Phosphatase: 73 IU/L (ref 44–121)
BUN/Creatinine Ratio: 15 (ref 10–24)
BUN: 16 mg/dL (ref 8–27)
Bilirubin Total: 0.6 mg/dL (ref 0.0–1.2)
CO2: 23 mmol/L (ref 20–29)
Calcium: 9 mg/dL (ref 8.6–10.2)
Chloride: 105 mmol/L (ref 96–106)
Creatinine, Ser: 1.07 mg/dL (ref 0.76–1.27)
Globulin, Total: 2.5 g/dL (ref 1.5–4.5)
Glucose: 96 mg/dL (ref 70–99)
Potassium: 4.4 mmol/L (ref 3.5–5.2)
Sodium: 139 mmol/L (ref 134–144)
Total Protein: 6.7 g/dL (ref 6.0–8.5)
eGFR: 74 mL/min/{1.73_m2} (ref >59)

## 2022-09-18 LAB — LIPID PANEL
Chol/HDL Ratio: 3.2 ratio (ref 0.0–5.0)
Cholesterol, Total: 243 mg/dL — ABNORMAL HIGH (ref 100–199)
HDL: 75 mg/dL (ref >39)
LDL Chol Calc (NIH): 162 mg/dL — ABNORMAL HIGH (ref 0–99)
Triglycerides: 41 mg/dL (ref 0–149)
VLDL Cholesterol Cal: 6 mg/dL (ref 5–40)

## 2022-09-23 ENCOUNTER — Telehealth: Payer: Self-pay

## 2022-09-23 NOTE — Telephone Encounter (Signed)
Spoke with pt. Pt was notified of lab results. Pt will f/u wit pharm-D as discussed.

## 2022-10-22 ENCOUNTER — Ambulatory Visit: Payer: 59 | Attending: Cardiovascular Disease | Admitting: Pharmacist

## 2022-10-22 DIAGNOSIS — E782 Mixed hyperlipidemia: Secondary | ICD-10-CM

## 2022-10-22 NOTE — Progress Notes (Signed)
Patient ID: Leonard Green                 DOB: Dec 20, 1950                    MRN: [id number]     HPI: Leonard Green is a 72 y.o. male patient referred to lipid clinic by Bernadene Person. PMH is significant for HLD and pre DM. Previously on Crestor but discontinued due to vertigo.  Patient presents today in good spirits. Continues to report vertigo and no longer feels safe driving. Saw ENT who prescribed meclizine but it did not help.  Has not followed up yet.  Discontinued rosuvastatin when vertigo started, however did not restart because his roommate advised him that statin were not safe medications to take. Roommate was argumentative so he decided to not take medication to keep the peace in the household. LDL has since increased.  Was not having any adverse effects while on rosuvastatin.  Current Medications: N/A Intolerances: N/A Risk Factors: HLD LDL goal: <70  Labs: TC 243, Trigs 41, HDL 75, LDL 162 (09/17/22)  Past Medical History:  Diagnosis Date   Hypertension     Current Outpatient Medications on File Prior to Visit  Medication Sig Dispense Refill   nortriptyline (PAMELOR) 25 MG capsule Take 25 mg by mouth at bedtime.     valsartan (DIOVAN) 160 MG tablet Take 1 tablet (160 mg total) by mouth daily. 90 tablet 3   No current facility-administered medications on file prior to visit.    No Known Allergies  Assessment/Plan:  1. Hyperlipidemia - Patient's LDL currently 162 which is above goal. Recommended patient restart rosuvastatin 20mg  since it is unknown if it was effective or not. Recommended not telling roommate about which medications he was taking. Will recheck lipid panel in 3 months. If LDL remains elevated, advised next step would be to increase Crestor to 40mg  daily and/or add PCSK9i. Patient voiced understanding.  Recommended patient contact ENT office since vertigo symptoms have persisted.  Restart rosuvastatin 20mg  daily Recheck lipid panel in 3  months  Laural Golden, PharmD, BCACP, CDCES, CPP [address], [address] [place], [place], [address] Phone: [phone], Fax: [phone]

## 2022-10-22 NOTE — Patient Instructions (Addendum)
It was nice seeing you again   We would like your LDL (bad cholesterol) to be less than 70  Please restart your rosuvastatin 20mg  once daily  We will recheck your cholesterol in about 2-3 months.  If still elevated, we can discuss increasing rosuvastatin or adding a new medication called Repatha  Please call your ENT doctor about your vertigo at Phone: [phone]    Laural Golden, PharmD, BCACP, CDCES, CPP [address] [place], [place], [address] Phone: [phone], Fax: [phone]

## 2022-10-23 ENCOUNTER — Encounter: Payer: Self-pay | Admitting: Pharmacist

## 2022-12-03 DIAGNOSIS — R42 Dizziness and giddiness: Secondary | ICD-10-CM | POA: Insufficient documentation

## 2023-01-09 ENCOUNTER — Ambulatory Visit (INDEPENDENT_AMBULATORY_CARE_PROVIDER_SITE_OTHER): Payer: 59

## 2023-01-09 VITALS — Ht 65.0 in | Wt 160.0 lb

## 2023-01-09 DIAGNOSIS — Z0000 Encounter for general adult medical examination without abnormal findings: Secondary | ICD-10-CM | POA: Diagnosis not present

## 2023-01-09 NOTE — Progress Notes (Signed)
Subjective:   Leonard Green is a 72 y.o. male who presents for Medicare Annual/Subsequent preventive examination.  Visit Complete: Virtual  I connected with  [name] on 01/09/23 by a audio enabled telemedicine application and verified that I am speaking with the correct person using two identifiers.  Patient Location: Home  Provider Location: Office/Clinic  I discussed the limitations of evaluation and management by telemedicine. The patient expressed understanding and agreed to proceed.    Review of Systems     Cardiac Risk Factors include: advanced age (>37men, >72 women);male gender;dyslipidemia     Objective:    Today's Vitals   01/09/23 1012  Weight: 160 lb (72.6 kg)  Height: 5\' 5"  (1.651 m)   Body mass index is 26.63 kg/m.     01/09/2023   10:22 AM 03/06/2022    9:58 AM 12/27/2021    1:33 PM  Advanced Directives  Does Patient Have a Medical Advance Directive? No No No  Would patient like information on creating a medical advance directive?  Yes (Inpatient - patient defers creating a medical advance directive at this time - Information given) No - Patient declined    Current Medications (verified) Outpatient Encounter Medications as of 01/09/2023  Medication Sig   meclizine (ANTIVERT) 25 MG tablet Take by mouth.   valsartan (DIOVAN) 160 MG tablet Take 1 tablet (160 mg total) by mouth daily.   nortriptyline (PAMELOR) 25 MG capsule Take 25 mg by mouth at bedtime. (Patient not taking: Reported on 01/09/2023)   rosuvastatin (CRESTOR) 20 MG tablet Take 1 tablet (20 mg total) by mouth daily. (Patient not taking: Reported on 01/09/2023)   No facility-administered encounter medications on file as of 01/09/2023.    Allergies (verified) Patient has no known allergies.   History: Past Medical History:  Diagnosis Date   Hypertension    Past Surgical History:  Procedure Laterality Date   JOINT REPLACEMENT     hip   Family History  Problem Relation  Age of Onset   Cancer Mother    Asthma Father    Cancer Father    Social History   Socioeconomic History   Marital status: Single    Spouse name: Not on file   Number of children: Not on file   Years of education: Not on file   Highest education level: Not on file  Occupational History   Not on file  Tobacco Use   Smoking status: Never    Passive exposure: Never   Smokeless tobacco: Never  Vaping Use   Vaping Use: Never used  Substance and Sexual Activity   Alcohol use: Never   Drug use: Never   Sexual activity: Not on file  Other Topics Concern   Not on file  Social History Narrative   Not on file   Social Determinants of Health   Financial Resource Strain: Low Risk  (01/09/2023)   Overall Financial Resource Strain (CARDIA)    Difficulty of Paying Living Expenses: Not hard at all  Food Insecurity: No Food Insecurity (01/09/2023)   Hunger Vital Sign    Worried About Running Out of Food in the Last Year: Never true    Ran Out of Food in the Last Year: Never true  Transportation Needs: No Transportation Needs (01/09/2023)   PRAPARE - Administrator, Civil Service (Medical): No    Lack of Transportation (Non-Medical): No  Physical Activity: Sufficiently Active (01/09/2023)   Exercise Vital Sign    Days of  Exercise per Week: 7 days    Minutes of Exercise per Session: 30 min  Stress: No Stress Concern Present (01/09/2023)   [employer] of Occupational Health - Occupational Stress Questionnaire    Feeling of Stress : Not at all  Social Connections: Moderately Integrated (01/09/2023)   Social Connection and Isolation Panel [NHANES]    Frequency of Communication with Friends and Family: More than three times a week    Frequency of Social Gatherings with Friends and Family: Twice a week    Attends Religious Services: More than 4 times per year    Active Member of [employer] or Organizations: Yes    Attends Engineer, structural: More than 4 times per year     Marital Status: Never married    Tobacco Counseling Counseling given: Not Answered   Clinical Intake:  Pre-visit preparation completed: Yes  Pain : No/denies pain     Nutritional Status: BMI 25 -29 Overweight Nutritional Risks: None Diabetes: No  How often do you need to have someone help you when you read instructions, pamphlets, or other written materials from your doctor or pharmacy?: 1 - Never  Interpreter Needed?: No  Information entered by :: NAllen LPN   Activities of Daily Living    01/09/2023   10:14 AM 03/06/2022    9:58 AM  In your present state of health, do you have any difficulty performing the following activities:  Hearing? 0 0  Vision? 0 0  Difficulty concentrating or making decisions? 0 0  Walking or climbing stairs? 0 0  Dressing or bathing? 0 0  Doing errands, shopping? 1 0  Comment can't drive due to vertigo   Preparing Food and eating ? N N  Using the Toilet? N N  In the past six months, have you accidently leaked urine? N N  Do you have problems with loss of bowel control? N N  Managing your Medications? N N  Managing your Finances? N N  Housekeeping or managing your Housekeeping? N N    Patient Care Team: Rema Fendt, NP as PCP - General (Nurse Practitioner) Thurmon Fair, MD as PCP - Cardiology (Cardiology)  Indicate any recent Medical Services you may have received from other than [facility] providers in the past year (date may be approximate).     Assessment:   This is a routine wellness examination for [place].  Hearing/Vision screen Hearing Screening - Comments:: Denies hearing trouble Vision Screening - Comments:: No regular eye exams  Dietary issues and exercise activities discussed:     Goals Addressed             This Visit's Progress    Patient Stated       01/09/2023, wants to get o ver vertigo       Depression Screen    01/09/2023   10:24 AM 07/19/2022    3:12 PM 03/06/2022    9:58 AM 01/21/2022    9:48  AM  PHQ 2/9 Scores  PHQ - 2 Score 3 3 0 0  PHQ- 9 Score 6 12      Fall Risk    01/09/2023   10:22 AM 08/12/2022    9:59 AM 07/19/2022    3:12 PM 07/16/2022    4:49 PM 03/06/2022    9:58 AM  Fall Risk   Falls in the past year? 1 0 0 0 0  Comment moved to quick      Number falls in past yr: 0 0 0 0 0  Injury with Fall? 0 0 0 0 0  Risk for fall due to : Medication side effect  No Fall Risks No Fall Risks No Fall Risks  Follow up Falls prevention discussed;Falls evaluation completed    Falls evaluation completed    MEDICARE RISK AT HOME:  Medicare Risk at Home - 01/09/23 1023     Any stairs in or around the home? Yes    If so, are there any without handrails? Yes    Home free of loose throw rugs in walkways, pet beds, electrical cords, etc? Yes    Adequate lighting in your home to reduce risk of falls? Yes    Life alert? No    Use of a cane, walker or w/c? No    Grab bars in the bathroom? No    Shower chair or bench in shower? No    Elevated toilet seat or a handicapped toilet? Yes             TIMED UP AND GO:  Was the test performed?  No    Cognitive Function:    03/06/2022   10:19 AM  MMSE - Mini Mental State Exam  Orientation to time 5  Orientation to Place 5  Registration 3  Attention/ Calculation 5  Recall 3  Language- name 2 objects 2  Language- repeat 1  Language- follow 3 step command 3  Language- read & follow direction 1  Write a sentence 1  Copy design 1  Total score 30        01/09/2023   10:25 AM 03/06/2022   10:17 AM  6CIT Screen  What Year? 0 points 0 points  What month? 0 points 0 points  What time? 0 points 0 points  Count back from 20 0 points 0 points  Months in reverse 0 points 0 points  Repeat phrase 6 points 0 points  Total Score 6 points 0 points    Immunizations Immunization History  Administered Date(s) Administered   Pneumococcal Conjugate-13 04/26/2016   Pneumococcal Polysaccharide-23 04/21/2017, 03/06/2022   Tdap  02/21/2018    TDAP status: Up to date  Flu Vaccine status: Declined, Education has been provided regarding the importance of this vaccine but patient still declined. Advised may receive this vaccine at local pharmacy or Health Dept. Aware to provide a copy of the vaccination record if obtained from local pharmacy or Health Dept. Verbalized acceptance and understanding.  Pneumococcal vaccine status: Up to date  Covid-19 vaccine status: Information provided on how to obtain vaccines.   Qualifies for Shingles Vaccine? Yes   Zostavax completed No   Shingrix Completed?: No.    Education has been provided regarding the importance of this vaccine. Patient has been advised to call insurance company to determine out of pocket expense if they have not yet received this vaccine. Advised may also receive vaccine at local pharmacy or Health Dept. Verbalized acceptance and understanding.  Screening Tests Health Maintenance  Topic Date Due   COVID-19 Vaccine (1) Never done   Colonoscopy  Never done   Zoster Vaccines- Shingrix (1 of 2) Never done   INFLUENZA VACCINE  02/13/2023   Medicare Annual Wellness (AWV)  01/09/2024   DTaP/Tdap/Td (2 - Td or Tdap) 02/22/2028   Pneumonia Vaccine 51+ Years old  Completed   Hepatitis C Screening  Completed   HPV VACCINES  Aged Out    Health Maintenance  Health Maintenance Due  Topic Date Due   COVID-19 Vaccine (1) Never done  Colonoscopy  Never done   Zoster Vaccines- Shingrix (1 of 2) Never done    Colorectal cancer screening: Type of screening: Colonoscopy. Completed 2023. Repeat every 3 years  Lung Cancer Screening: (Low Dose CT Chest recommended if Age 42-80 years, 20 pack-year currently smoking OR have quit w/in 15years.) does not qualify.   Lung Cancer Screening Referral: no  Additional Screening:  Hepatitis C Screening: does qualify; Completed 03/06/2022  Vision Screening: Recommended annual ophthalmology exams for early detection of  glaucoma and other disorders of the eye. Is the patient up to date with their annual eye exam?  No  Who is the provider or what is the name of the office in which the patient attends annual eye exams? none If pt is not established with a provider, would they like to be referred to a provider to establish care? No .   Dental Screening: Recommended annual dental exams for proper oral hygiene  Diabetic Foot Exam: n/a  Community Resource Referral / Chronic Care Management: CRR required this visit?  No   CCM required this visit?  No     Plan:     I have personally reviewed and noted the following in the patient's chart:   Medical and social history Use of alcohol, tobacco or illicit drugs  Current medications and supplements including opioid prescriptions. Patient is not currently taking opioid prescriptions. Functional ability and status Nutritional status Physical activity Advanced directives List of other physicians Hospitalizations, surgeries, and ER visits in previous 12 months Vitals Screenings to include cognitive, depression, and falls Referrals and appointments  In addition, I have reviewed and discussed with patient certain preventive protocols, quality metrics, and best practice recommendations. A written personalized care plan for preventive services as well as general preventive health recommendations were provided to patient.     Barb Merino, LPN   10/26/2438   After Visit Summary: (Pick Up) Due to this being a telephonic visit, with patients personalized plan was offered to patient and patient has requested to Pick up at office.  Nurse Notes: none

## 2023-01-09 NOTE — Patient Instructions (Signed)
Leonard Green , Thank you for taking time to come for your Medicare Wellness Visit. I appreciate your ongoing commitment to your health goals. Please review the following plan we discussed and let me know if I can assist you in the future.   These are the goals we discussed:  Goals      Patient Stated     01/09/2023, wants to get o ver vertigo        This is a list of the screening recommended for you and due dates:  Health Maintenance  Topic Date Due   COVID-19 Vaccine (1) Never done   Colon Cancer Screening  Never done   Zoster (Shingles) Vaccine (1 of 2) Never done   Flu Shot  02/13/2023   Medicare Annual Wellness Visit  01/09/2024   DTaP/Tdap/Td vaccine (2 - Td or Tdap) 02/22/2028   Pneumonia Vaccine  Completed   Hepatitis C Screening  Completed   HPV Vaccine  Aged Out    Advanced directives: Advance directive discussed with you today.   Conditions/risks identified: none  Next appointment: Follow up in one year for your annual wellness visit.   Preventive Care 73 Years and Older, Male  Preventive care refers to lifestyle choices and visits with your health care provider that can promote health and wellness. What does preventive care include? A yearly physical exam. This is also called an annual well check. Dental exams once or twice a year. Routine eye exams. Ask your health care provider how often you should have your eyes checked. Personal lifestyle choices, including: Daily care of your teeth and gums. Regular physical activity. Eating a healthy diet. Avoiding tobacco and drug use. Limiting alcohol use. Practicing safe sex. Taking low doses of aspirin every day. Taking vitamin and mineral supplements as recommended by your health care provider. What happens during an annual well check? The services and screenings done by your health care provider during your annual well check will depend on your age, overall health, lifestyle risk factors, and family history of  disease. Counseling  Your health care provider may ask you questions about your: Alcohol use. Tobacco use. Drug use. Emotional well-being. Home and relationship well-being. Sexual activity. Eating habits. History of falls. Memory and ability to understand (cognition). Work and work Astronomer. Screening  You may have the following tests or measurements: Height, weight, and BMI. Blood pressure. Lipid and cholesterol levels. These may be checked every 5 years, or more frequently if you are over 36 years old. Skin check. Lung cancer screening. You may have this screening every year starting at age 5 if you have a 30-pack-year history of smoking and currently smoke or have quit within the past 15 years. Fecal occult blood test (FOBT) of the stool. You may have this test every year starting at age 50. Flexible sigmoidoscopy or colonoscopy. You may have a sigmoidoscopy every 5 years or a colonoscopy every 10 years starting at age 17. Prostate cancer screening. Recommendations will vary depending on your family history and other risks. Hepatitis C blood test. Hepatitis B blood test. Sexually transmitted disease (STD) testing. Diabetes screening. This is done by checking your blood sugar (glucose) after you have not eaten for a while (fasting). You may have this done every 1-3 years. Abdominal aortic aneurysm (AAA) screening. You may need this if you are a current or former smoker. Osteoporosis. You may be screened starting at age 30 if you are at high risk. Talk with your health care provider about your  test results, treatment options, and if necessary, the need for more tests. Vaccines  Your health care provider may recommend certain vaccines, such as: Influenza vaccine. This is recommended every year. Tetanus, diphtheria, and acellular pertussis (Tdap, Td) vaccine. You may need a Td booster every 10 years. Zoster vaccine. You may need this after age 56. Pneumococcal 13-valent  conjugate (PCV13) vaccine. One dose is recommended after age 27. Pneumococcal polysaccharide (PPSV23) vaccine. One dose is recommended after age 59. Talk to your health care provider about which screenings and vaccines you need and how often you need them. This information is not intended to replace advice given to you by your health care provider. Make sure you discuss any questions you have with your health care provider. Document Released: 07/28/2015 Document Revised: 03/20/2016 Document Reviewed: 05/02/2015 Elsevier Interactive Patient Education  2017 [employer] Prevention in the Home Falls can cause injuries. They can happen to people of all ages. There are many things you can do to make your home safe and to help prevent falls. What can I do on the outside of my home? Regularly fix the edges of walkways and driveways and fix any cracks. Remove anything that might make you trip as you walk through a door, such as a raised step or threshold. Trim any bushes or trees on the path to your home. Use bright outdoor lighting. Clear any walking paths of anything that might make someone trip, such as rocks or tools. Regularly check to see if handrails are loose or broken. Make sure that both sides of any steps have handrails. Any raised decks and porches should have guardrails on the edges. Have any leaves, snow, or ice cleared regularly. Use sand or salt on walking paths during winter. Clean up any spills in your garage right away. This includes oil or grease spills. What can I do in the bathroom? Use night lights. Install grab bars by the toilet and in the tub and shower. Do not use towel bars as grab bars. Use non-skid mats or decals in the tub or shower. If you need to sit down in the shower, use a plastic, non-slip stool. Keep the floor dry. Clean up any water that spills on the floor as soon as it happens. Remove soap buildup in the tub or shower regularly. Attach bath mats  securely with double-sided non-slip rug tape. Do not have throw rugs and other things on the floor that can make you trip. What can I do in the bedroom? Use night lights. Make sure that you have a light by your bed that is easy to reach. Do not use any sheets or blankets that are too big for your bed. They should not hang down onto the floor. Have a firm chair that has side arms. You can use this for support while you get dressed. Do not have throw rugs and other things on the floor that can make you trip. What can I do in the kitchen? Clean up any spills right away. Avoid walking on wet floors. Keep items that you use a lot in easy-to-reach places. If you need to reach something above you, use a strong step stool that has a grab bar. Keep electrical cords out of the way. Do not use floor polish or wax that makes floors slippery. If you must use wax, use non-skid floor wax. Do not have throw rugs and other things on the floor that can make you trip. What can I do with my  stairs? Do not leave any items on the stairs. Make sure that there are handrails on both sides of the stairs and use them. Fix handrails that are broken or loose. Make sure that handrails are as long as the stairways. Check any carpeting to make sure that it is firmly attached to the stairs. Fix any carpet that is loose or worn. Avoid having throw rugs at the top or bottom of the stairs. If you do have throw rugs, attach them to the floor with carpet tape. Make sure that you have a light switch at the top of the stairs and the bottom of the stairs. If you do not have them, ask someone to add them for you. What else can I do to help prevent falls? Wear shoes that: Do not have high heels. Have rubber bottoms. Are comfortable and fit you well. Are closed at the toe. Do not wear sandals. If you use a stepladder: Make sure that it is fully opened. Do not climb a closed stepladder. Make sure that both sides of the stepladder  are locked into place. Ask someone to hold it for you, if possible. Clearly mark and make sure that you can see: Any grab bars or handrails. First and last steps. Where the edge of each step is. Use tools that help you move around (mobility aids) if they are needed. These include: Canes. Walkers. Scooters. Crutches. Turn on the lights when you go into a dark area. Replace any light bulbs as soon as they burn out. Set up your furniture so you have a clear path. Avoid moving your furniture around. If any of your floors are uneven, fix them. If there are any pets around you, be aware of where they are. Review your medicines with your doctor. Some medicines can make you feel dizzy. This can increase your chance of falling. Ask your doctor what other things that you can do to help prevent falls. This information is not intended to replace advice given to you by your health care provider. Make sure you discuss any questions you have with your health care provider. Document Released: 04/27/2009 Document Revised: 12/07/2015 Document Reviewed: 08/05/2014 Elsevier Interactive Patient Education  2017 [employer].

## 2023-01-14 ENCOUNTER — Telehealth: Payer: Self-pay | Admitting: Nurse Practitioner

## 2023-01-14 DIAGNOSIS — E782 Mixed hyperlipidemia: Secondary | ICD-10-CM

## 2023-01-14 NOTE — Telephone Encounter (Signed)
*  STAT* If patient is at the pharmacy, call can be transferred to refill team.   1. Which medications need to be refilled? (please list name of each medication and dose if known)   rosuvastatin (CRESTOR) 20 MG tablet    2. Which pharmacy/location (including street and city if local pharmacy) is medication to be sent to?  [id number] - [place], [place] - [address]      3. Do they need a 30 day or 90 day supply? 90 day     Pt is completely out of medication

## 2023-01-14 NOTE — Telephone Encounter (Signed)
Have returned call to the pt to advise him that we had sent in a 90 days supply of [facility]atin to [facility] with 1 additional refill.  Per pt, he hasn't called his pharmacy, he just called [place].  Advised pt to place a ref[facility] to [facility] and to call us back if he needed [place].  He was thankful for the call.

## 2023-01-20 MED ORDER — ROSUVASTATIN CALCIUM 20 MG PO TABS: 20.0000 mg | ORAL_TABLET | Freq: Every day | ORAL | 1 refills | Status: DC

## 2023-01-20 NOTE — Addendum Note (Signed)
Addended by: Cheree Ditto on: 01/20/2023 10:54 AM   Modules accepted: Orders

## 2023-01-27 NOTE — Therapy (Signed)
OUTPATIENT PHYSICAL THERAPY VESTIBULAR EVALUATION     Patient Name: Leonard Green MRN: [id number] DOB:1951-07-09, 72 y.o., male Today's Date: 01/28/2023  END OF SESSION:  PT End of Session - 01/28/23 0847     Visit Number 1    Number of Visits 13    Date for PT Re-Evaluation 03/11/23    Authorization Type [employer] Medicare    PT Start Time 0801    PT Stop Time 0841    PT Time Calculation (min) 40 min    Equipment Utilized During Treatment Gait belt    Activity Tolerance Patient tolerated treatment well    Behavior During Therapy WFL for tasks assessed/performed             Past Medical History:  Diagnosis Date   Hypertension    Past Surgical History:  Procedure Laterality Date   JOINT REPLACEMENT     hip   Patient Active Problem List   Diagnosis Date Noted   BPPV (benign paroxysmal positional vertigo), unspecified laterality 09/03/2022   Other dysphagia 07/18/2022   Hyperlipidemia 03/07/2022   Prediabetes 03/07/2022   Spondylosis without myelopathy or radiculopathy, cervical region 02/19/2022   Fracture of femoral neck, left (HCC) 05/21/2011    PCP: Rema Fendt, NP  REFERRING PROVIDER: Alphonzo Dublin, PA-C  REFERRING DIAG: R42 (ICD-10-CM) - Dizziness and giddiness  THERAPY DIAG:  Dizziness and giddiness  Unsteadiness on feet  ONSET DATE: 7 months  Rationale for Evaluation and Treatment: Rehabilitation  SUBJECTIVE:   SUBJECTIVE STATEMENT: Patient reports dizziness ongoing for 7 months. Reports that he has been taking Mecliizine without improvement. Denies headaches and reports not being on migraine meds.   Dizziness is described as "on a wave and rocking" and is constant. Better with sitting down, worse when standing up from bending over.   Denies head trauma, vision changes/double vision, hearing loss, otalgia, motion sickness.   Reports that this problem hit around christmas after he had a cold. Notes occasional tinnitus.     Reports dizziness when turning head while driving and when accelerating/decelerating the car.     Pt accompanied by: self  PERTINENT HISTORY: HTN, hip replacement, vestibular migraine; per pt- hx of polio and R LE is weaker  PAIN:  Are you having pain? No  PRECAUTIONS: Fall  RED FLAGS: None   WEIGHT BEARING RESTRICTIONS: No  FALLS: Has patient fallen in last 6 months? No  LIVING ENVIRONMENT: Lives with:  friend Lives in: House/apartment Stairs:  3 steps to enter without rail; 1 story  Has following equipment at home: Single point cane  PLOF: Independent; retired  PATIENT GOALS: improve dizziness  OBJECTIVE:   DIAGNOSTIC FINDINGS: none recent  COGNITION: Overall cognitive status: Within functional limits for tasks assessed   SENSATION: Denies N/T in UEs/LEs  POSTURE:  rounded shoulders, forward head, and increased thoracic kyphosis  GAIT: Gait pattern:  limited B foot clearance, lateral trunk lean Assistive device utilized: None Level of assistance: Modified independence   PATIENT SURVEYS:  FOTO 48, 60  VESTIBULAR ASSESSMENT:  GENERAL OBSERVATION: pt wears readers   OCULOMOTOR EXAM:  Ocular Alignment:  slight R esotropia  Ocular ROM: No Limitations  Spontaneous Nystagmus: absent  Gaze-Induced Nystagmus: absent  Smooth Pursuits: intact  Saccades: intact  Convergence/Divergence: 5-6 cm    VESTIBULAR - OCULAR REFLEX:   Slow VOR: Comment: c/o mild dizziness vertical and visible retinal slip; intact horizontal  VOR Cancellation: Normal; c/o mild dizziness  Head-Impulse Test: HIT Right: negative HIT Left: positive  POSITIONAL TESTING:  Right Roll Test: negative Left Roll Test: negative   Right Sidelying: negative; c/o dizziness upon sitting up  Left Sidelying: negative; c/o dizziness upon sitting up     M-CTSIB  Condition 1: Firm Surface, EO 30 Sec, Mild Sway  Condition 2: Firm Surface, EC 30 Sec, Mild and Moderate Sway  Condition  3: Foam Surface, EO 30 Sec, Mild Sway  Condition 4: Foam Surface, EC ~10 Sec, Severe Sway       VESTIBULAR TREATMENT:                                                                                                   DATE: 01/28/23    PATIENT EDUCATION: Education details: prognosis, POC, HEP- with edu on safety and intended level of sx Person educated: Patient Education method: Explanation, Demonstration, Tactile cues, Verbal cues, and Handouts Education comprehension: verbalized understanding  HOME EXERCISE PROGRAM: Access Code: [email or web address] URL: [email or web address] Date: 01/28/2023 Prepared by: [facility] - Outpatient  Rehab - [facility]  Exercises - Standing Gaze Stabilization with Head Rotation  - 1 x daily - 5 x weekly - 2 sets - 30 sec hold - Standing Gaze Stabilization with Head Nod  - 1 x daily - 5 x weekly - 2 sets - 30 sec hold - Brandt-Daroff Vestibular Exercise  - 1 x daily - 5 x weekly - 2 sets - 3-5 reps - Romberg Stance with Eyes Closed  - 1 x daily - 5 x weekly - 2 sets - 30 sec hold  GOALS: Goals reviewed with patient? Yes  SHORT TERM GOALS: Target date: 02/18/2023  Patient to be independent with initial HEP. Baseline: HEP initiated Goal status: INITIAL    LONG TERM GOALS: Target date: 03/11/2023  Patient to be independent with advanced HEP. Baseline: Not yet initiated  Goal status: INITIAL  Patient to report 0/10 dizziness with standing vertical and horizontal VOR for 30 seconds. Baseline: Unable Goal status: INITIAL  Patient will report 0/10 dizziness with bed mobility.  Baseline: Symptomatic  Goal status: INITIAL  Patient to demonstrate moderate sway with M-CTSIB condition with eyes closed/foam surface in order to improve safety in environments with uneven surfaces and dim lighting. Baseline: 10 sec with severe sway Goal status: INITIAL  Patient to score at least 20/24 on DGI in order to decrease risk of falls. 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  Baseline: - Goal status: INITIAL  Patient to score at least 60 on FOTO in order to indicate improved functional outcomes.  Baseline: 48 Goal status: INITIAL    ASSESSMENT:  CLINICAL IMPRESSION:  Patient is a 72 y/o M presenting to OPPT with c/o chornic dizziness for the past 7 months; notes onset occurred after getting over a cold around Christmas 2023. Of note, PMH includes vestibular migraine, diagnosed by ENT. Patient today presenting with flexed posture, gait deviations, R esotropia, convergence insufficiency, dizziness with vertical VOR, VOR cancellation, positive L HIT, motion sensitivity, and decreased use of vestibular system for balance.  Patient was educated on gentle balance and habituation HEP and reported understanding. Prior to current episode, patient was independent. Would benefit from skilled PT services 2 x/week for 6 weeks to address aforementioned impairments in order to optimize level of function.    OBJECTIVE IMPAIRMENTS: Abnormal gait, decreased activity tolerance, decreased balance, and dizziness.   ACTIVITY LIMITATIONS: carrying, lifting, bending, sitting, standing, squatting, stairs, transfers, bed mobility, bathing, toileting, dressing, reach over head, and hygiene/grooming  PARTICIPATION LIMITATIONS: meal prep, cleaning, laundry, driving, shopping, community activity, and church  PERSONAL FACTORS: Age, Past/current experiences, Time since onset of injury/illness/exacerbation, Transportation, and 3+ comorbidities: HTN, hip replacement, vestibular migraine; per pt- hx of polio and R LE is weaker  are also affecting patient's functional outcome.   REHAB POTENTIAL: Good  CLINICAL DECISION MAKING: Evolving/moderate complexity  EVALUATION COMPLEXITY: Moderate   PLAN:  PT FREQUENCY: 2x/week  PT DURATION: 6 weeks  PLANNED INTERVENTIONS:  Therapeutic exercises, Therapeutic activity, Neuromuscular re-education, Balance training, Gait training, Patient/Family education, Self Care, Joint mobilization, Stair training, Vestibular training, Canalith repositioning, DME instructions, Dry Needling, Electrical stimulation, Cryotherapy, Moist heat, Taping, Manual therapy, and Re-evaluation  PLAN FOR NEXT SESSION: DGI, review HEP; progress VOR and habituation     Anette Guarneri, PT, DPT 01/28/23 8:52 AM  [facility] [address] [place], [place] [address] Phone # [phone] Fax # [phone]

## 2023-01-28 ENCOUNTER — Ambulatory Visit: Payer: 59 | Attending: Anesthesiology | Admitting: Physical Therapy

## 2023-01-28 ENCOUNTER — Other Ambulatory Visit: Payer: Self-pay

## 2023-01-28 DIAGNOSIS — R42 Dizziness and giddiness: Secondary | ICD-10-CM | POA: Diagnosis present

## 2023-01-28 DIAGNOSIS — R2681 Unsteadiness on feet: Secondary | ICD-10-CM | POA: Diagnosis present

## 2023-02-03 ENCOUNTER — Ambulatory Visit: Payer: 59 | Admitting: Physical Therapy

## 2023-02-03 ENCOUNTER — Encounter: Payer: Self-pay | Admitting: Physical Therapy

## 2023-02-03 DIAGNOSIS — R42 Dizziness and giddiness: Secondary | ICD-10-CM

## 2023-02-03 DIAGNOSIS — R2681 Unsteadiness on feet: Secondary | ICD-10-CM

## 2023-02-03 NOTE — Therapy (Signed)
OUTPATIENT PHYSICAL THERAPY VESTIBULAR TREATMENT NOTE     Patient Name: Leonard Green MRN: [id number] DOB:28-Mar-1951, 72 y.o., male Today's Date: 02/03/2023  END OF SESSION:  PT End of Session - 02/03/23 0757     Visit Number 2    Number of Visits 13    Date for PT Re-Evaluation 03/11/23    Authorization Type [employer] Medicare    PT Start Time 0802    PT Stop Time 0841    PT Time Calculation (min) 39 min    Equipment Utilized During Treatment Gait belt    Activity Tolerance Patient tolerated treatment well    Behavior During Therapy WFL for tasks assessed/performed              Past Medical History:  Diagnosis Date   Hypertension    Past Surgical History:  Procedure Laterality Date   JOINT REPLACEMENT     hip   Patient Active Problem List   Diagnosis Date Noted   BPPV (benign paroxysmal positional vertigo), unspecified laterality 09/03/2022   Other dysphagia 07/18/2022   Hyperlipidemia 03/07/2022   Prediabetes 03/07/2022   Spondylosis without myelopathy or radiculopathy, cervical region 02/19/2022   Fracture of femoral neck, left (HCC) 05/21/2011    PCP: Rema Fendt, NP  REFERRING PROVIDER: Alphonzo Dublin, PA-C  REFERRING DIAG: R42 (ICD-10-CM) - Dizziness and giddiness  THERAPY DIAG:  Dizziness and giddiness  Unsteadiness on feet  ONSET DATE: 7 months  Rationale for Evaluation and Treatment: Rehabilitation  SUBJECTIVE:   SUBJECTIVE STATEMENT: No changes since last week.  It doesn't seem like the dizziness is quite as bad, maybe a little better.  Still taking the Meclizine daily.  Pt accompanied by: self  PERTINENT HISTORY: HTN, hip replacement, vestibular migraine; per pt- hx of polio and R LE is weaker  PAIN:  Are you having pain? No  PRECAUTIONS: Fall  RED FLAGS: None   WEIGHT BEARING RESTRICTIONS: No  FALLS: Has patient fallen in last 6 months? No  LIVING ENVIRONMENT: Lives with:  friend Lives in:  House/apartment Stairs:  3 steps to enter without rail; 1 story  Has following equipment at home: Single point cane  PLOF: Independent; retired  PATIENT GOALS: improve dizziness  OBJECTIVE:    TODAY'S TREATMENT: 02/03/2023 Activity Comments  DGI score 18/24 <19/24 indicates increased fall risk  VOR review -starts in sitting-gaze stabilization horizontal, then vertical  -standing gaze stabilization horizontal -standing gaze stabilization, vertical   No symptoms horizontal -5/10 symptoms vertical   -rates 6/10  -rates 6-7/10  Pt performs VOR cancellation with HEP review (this was not given as part of HEP) Rates as 5/10  Romberg stance EC 2 x 30 sec Min sway  Reviewed Brand-Daroff (pt performs incorrectly looking down to same side) Cues for correct technique, performed x 2 today; reports more dizziness coming up from R side       [facility] PT Assessment - 02/03/23 0001       Standardized Balance Assessment   Standardized Balance Assessment Dynamic Gait Index      Dynamic Gait Index   Level Surface Mild Impairment    Change in Gait Speed Normal    Gait with Horizontal Head Turns Mild Impairment    Gait with Vertical Head Turns Mild Impairment    Gait and Pivot Turn Normal    Step Over Obstacle Mild Impairment    Step Around Obstacles Mild Impairment    Steps Mild Impairment    Total Score 18  DGI comment: Scores <19/24 indicates increased fall risk              PATIENT EDUCATION: Education details: Reprinted HEP and reviewed/cues provided for technique Person educated: Patient Education method: Explanation, Demonstration, Tactile cues, Verbal cues, and Handouts Education comprehension: verbalized understanding, returned demonstration, and needs further education  -------------------------------------------------------- Objective measures taken below at initial evaluation:  DIAGNOSTIC FINDINGS: none recent  COGNITION: Overall cognitive status: Within  functional limits for tasks assessed   SENSATION: Denies N/T in UEs/LEs  POSTURE:  rounded shoulders, forward head, and increased thoracic kyphosis  GAIT: Gait pattern:  limited B foot clearance, lateral trunk lean Assistive device utilized: None Level of assistance: Modified independence   PATIENT SURVEYS:  FOTO 48, 60  VESTIBULAR ASSESSMENT:  GENERAL OBSERVATION: pt wears readers   OCULOMOTOR EXAM:  Ocular Alignment:  slight R esotropia  Ocular ROM: No Limitations  Spontaneous Nystagmus: absent  Gaze-Induced Nystagmus: absent  Smooth Pursuits: intact  Saccades: intact  Convergence/Divergence: 5-6 cm    VESTIBULAR - OCULAR REFLEX:   Slow VOR: Comment: c/o mild dizziness vertical and visible retinal slip; intact horizontal  VOR Cancellation: Normal; c/o mild dizziness  Head-Impulse Test: HIT Right: negative HIT Left: positive     POSITIONAL TESTING:  Right Roll Test: negative Left Roll Test: negative   Right Sidelying: negative; c/o dizziness upon sitting up  Left Sidelying: negative; c/o dizziness upon sitting up     M-CTSIB  Condition 1: Firm Surface, EO 30 Sec, Mild Sway  Condition 2: Firm Surface, EC 30 Sec, Mild and Moderate Sway  Condition 3: Foam Surface, EO 30 Sec, Mild Sway  Condition 4: Foam Surface, EC ~10 Sec, Severe Sway       VESTIBULAR TREATMENT:                                                                                                   DATE: 01/28/23    PATIENT EDUCATION: Education details: prognosis, POC, HEP- with edu on safety and intended level of sx Person educated: Patient Education method: Explanation, Demonstration, Tactile cues, Verbal cues, and Handouts Education comprehension: verbalized understanding  HOME EXERCISE PROGRAM: Access Code: [email or web address] URL: [email or web address] Date: 01/28/2023 Prepared by: [facility] - Outpatient  Rehab - [facility]  Exercises - Standing Gaze Stabilization with  Head Rotation  - 1 x daily - 5 x weekly - 2 sets - 30 sec hold - Standing Gaze Stabilization with Head Nod  - 1 x daily - 5 x weekly - 2 sets - 30 sec hold - Brandt-Daroff Vestibular Exercise  - 1 x daily - 5 x weekly - 2 sets - 3-5 reps - Romberg Stance with Eyes Closed  - 1 x daily - 5 x weekly - 2 sets - 30 sec hold  GOALS: Goals reviewed with patient? Yes  SHORT TERM GOALS: Target date: 02/18/2023  Patient to be independent with initial HEP. Baseline: HEP initiated Goal status: IN PROGRESS    LONG TERM GOALS: Target date: 03/11/2023  Patient to be independent with advanced HEP. Baseline: Not yet initiated  Goal status:  IN PROGRESS  Patient to report 0/10 dizziness with standing vertical and horizontal VOR for 30 seconds. Baseline: Unable Goal status: IN PROGRESS  Patient will report 0/10 dizziness with bed mobility.  Baseline: Symptomatic  Goal status: IN PROGRESS  Patient to demonstrate moderate sway with M-CTSIB condition with eyes closed/foam surface in order to improve safety in environments with uneven surfaces and dim lighting. Baseline: 10 sec with severe sway Goal status: IN PROGRESS  Patient to score at least 20/24 on DGI in order to decrease risk of falls. 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  Baseline: - Goal status: IN PROGRESS  Patient to score at least 60 on FOTO in order to indicate improved functional outcomes.  Baseline: 48 Goal status: IN PROGRESS    ASSESSMENT:  CLINICAL IMPRESSION: DIG score performed today, with pt scoring 18/24, which indicates increased fall risk.  He notes increased difficulty with head nods>head turns and going down steps/over obstacles.  Spent majority of rest of session reviewing HEP.  Pt initially demonstrates VOR cancellation when asked to review standing gaze stabilization; in addition, he reports he forgot to  do standing gaze stabilization, but has been performing in sitting. When reviewing Brandt-Daroff, he performs looking to same side as lying down and reports this is how he did it at home.  Instructions and cues provided for correct HEP performance for optimal work to improve dizziness. He reports some increase in dizziness throughout session today, but overall no c/o at end of session.  OBJECTIVE IMPAIRMENTS: Abnormal gait, decreased activity tolerance, decreased balance, and dizziness.   ACTIVITY LIMITATIONS: carrying, lifting, bending, sitting, standing, squatting, stairs, transfers, bed mobility, bathing, toileting, dressing, reach over head, and hygiene/grooming  PARTICIPATION LIMITATIONS: meal prep, cleaning, laundry, driving, shopping, community activity, and church  PERSONAL FACTORS: Age, Past/current experiences, Time since onset of injury/illness/exacerbation, Transportation, and 3+ comorbidities: HTN, hip replacement, vestibular migraine; per pt- hx of polio and R LE is weaker  are also affecting patient's functional outcome.   REHAB POTENTIAL: Good  CLINICAL DECISION MAKING: Evolving/moderate complexity  EVALUATION COMPLEXITY: Moderate   PLAN:  PT FREQUENCY: 2x/week  PT DURATION: 6 weeks  PLANNED INTERVENTIONS: Therapeutic exercises, Therapeutic activity, Neuromuscular re-education, Balance training, Gait training, Patient/Family education, Self Care, Joint mobilization, Stair training, Vestibular training, Canalith repositioning, DME instructions, Dry Needling, Electrical stimulation, Cryotherapy, Moist heat, Taping, Manual therapy, and Re-evaluation  PLAN FOR NEXT SESSION: Review HEP; progress VOR and habituation     Lonia Blood, PT 02/03/23 8:46 AM Phone: [phone] Fax: [phone]  [facility] at [facility] [address] [place], [place] [address] Phone # [phone] Fax # [phone]

## 2023-02-04 NOTE — Therapy (Signed)
OUTPATIENT PHYSICAL THERAPY VESTIBULAR TREATMENT NOTE     Patient Name: Leonard Green MRN: [id number] DOB:[birth year], 72 y.o., male Today's Date: 02/05/2023  END OF SESSION:  PT End of Session - 02/05/23 0844     Visit Number 3    Number of Visits 13    Date for PT Re-Evaluation 03/11/23    Authorization Type [employer] Medicare    PT Start Time 0801    PT Stop Time 0844    PT Time Calculation (min) 43 min    Activity Tolerance Patient tolerated treatment well    Behavior During Therapy [facility] for tasks assessed/performed               Past Medical History:  Diagnosis Date   Hypertension    Past Surgical History:  Procedure Laterality Date   JOINT REPLACEMENT     hip   Patient Active Problem List   Diagnosis Date Noted   BPPV (benign paroxysmal positional vertigo), unspecified laterality 09/03/2022   Other dysphagia 07/18/2022   Hyperlipidemia 03/07/2022   Prediabetes 03/07/2022   Spondylosis without myelopathy or radiculopathy, cervical region 02/19/2022   Fracture of femoral neck, left (HCC) 05/21/2011    PCP: Rema Fendt, NP  REFERRING PROVIDER: Alphonzo Dublin, PA-C  REFERRING DIAG: R42 (ICD-10-CM) - Dizziness and giddiness  THERAPY DIAG:  Dizziness and giddiness  Unsteadiness on feet  ONSET DATE: 7 months  Rationale for Evaluation and Treatment: Rehabilitation  SUBJECTIVE:   SUBJECTIVE STATEMENT: "This thing is still crazy." Reports mild-moderate dizziness this AM.  Pt accompanied by: self  PERTINENT HISTORY: HTN, hip replacement, vestibular migraine; per pt- hx of polio and R LE is weaker  PAIN:  Are you having pain? No  PRECAUTIONS: Fall  RED FLAGS: None   WEIGHT BEARING RESTRICTIONS: No  FALLS: Has patient fallen in last 6 months? No  LIVING ENVIRONMENT: Lives with:  friend Lives in: House/apartment Stairs:  3 steps to enter without rail; 1 story  Has following equipment at home: Single point cane  PLOF:  Independent; retired  PATIENT GOALS: improve dizziness  OBJECTIVE:     TODAY'S TREATMENT: 02/05/23 Activity Comments  romberg EC 30" Mild sway; c/o mild-mod dizziness  romberg EC + head nods/turns 2x30" each c/o mild-mod dizziness horizontal, moderate vertical   wall bumps EO and EC hip and shoulder strategy  Cueing for sequencing; able to perform to at good speed without excessive sway  standing head turns to targets 30" Quick motions; no increase in dizziness   standing head nods to targets 30" Quick motions; no increase in dizziness   Standing VOR horizontal and vertical 30" Corrective cueing required; c/o increased dizziness in both planes (worse vertical)  Francee Piccolo daroff R/L 2x each side EO,  Report of very mild dizziness upon sitting up from L side; required corrective cueing of form and positioning; EC did not increase sx    HOME EXERCISE PROGRAM Last updated: 02/05/23 Access Code: [facility] URL: [email or web address] Date: 02/05/2023 Prepared by: [facility] - Outpatient  Rehab - [facility]  Program Notes perform standing exercises at counter for safety  Exercises - Standing Gaze Stabilization with Head Rotation  - 1 x daily - 5 x weekly - 2 sets - 30 sec hold - Standing Gaze Stabilization with Head Nod  - 1 x daily - 5 x weekly - 2 sets - 30 sec hold - Brandt-Daroff Vestibular Exercise  - 1 x daily - 5 x weekly - 2 sets - 3-5 reps -  Romberg Stance with Eyes Closed  - 1 x daily - 5 x weekly - 2 sets - 30 sec hold - Wide Stance with Eyes Closed and Head Nods  - 1 x daily - 5 x weekly - 2-3 sets - 30 sec hold    PATIENT EDUCATION: Education details: HEP update with notes for proper form Person educated: Patient Education method: Explanation, Demonstration, Tactile cues, Verbal cues, and Handouts Education comprehension: verbalized understanding and returned demonstration    -------------------------------------------------------- Objective measures  taken below at initial evaluation:  DIAGNOSTIC FINDINGS: none recent  COGNITION: Overall cognitive status: Within functional limits for tasks assessed   SENSATION: Denies N/T in UEs/LEs  POSTURE:  rounded shoulders, forward head, and increased thoracic kyphosis  GAIT: Gait pattern:  limited B foot clearance, lateral trunk lean Assistive device utilized: None Level of assistance: Modified independence   PATIENT SURVEYS:  FOTO 48, 60  VESTIBULAR ASSESSMENT:  GENERAL OBSERVATION: pt wears readers   OCULOMOTOR EXAM:  Ocular Alignment:  slight R esotropia  Ocular ROM: No Limitations  Spontaneous Nystagmus: absent  Gaze-Induced Nystagmus: absent  Smooth Pursuits: intact  Saccades: intact  Convergence/Divergence: 5-6 cm    VESTIBULAR - OCULAR REFLEX:   Slow VOR: Comment: c/o mild dizziness vertical and visible retinal slip; intact horizontal  VOR Cancellation: Normal; c/o mild dizziness  Head-Impulse Test: HIT Right: negative HIT Left: positive     POSITIONAL TESTING:  Right Roll Test: negative Left Roll Test: negative   Right Sidelying: negative; c/o dizziness upon sitting up  Left Sidelying: negative; c/o dizziness upon sitting up     M-CTSIB  Condition 1: Firm Surface, EO 30 Sec, Mild Sway  Condition 2: Firm Surface, EC 30 Sec, Mild and Moderate Sway  Condition 3: Foam Surface, EO 30 Sec, Mild Sway  Condition 4: Foam Surface, EC ~10 Sec, Severe Sway       VESTIBULAR TREATMENT:                                                                                                   DATE: 01/28/23    PATIENT EDUCATION: Education details: prognosis, POC, HEP- with edu on safety and intended level of sx Person educated: Patient Education method: Explanation, Demonstration, Tactile cues, Verbal cues, and Handouts Education comprehension: verbalized understanding  HOME EXERCISE PROGRAM: Access Code: [email or web address] URL: [email or web address] Date:  01/28/2023 Prepared by: [facility] - Outpatient  Rehab - [facility]  Exercises - Standing Gaze Stabilization with Head Rotation  - 1 x daily - 5 x weekly - 2 sets - 30 sec hold - Standing Gaze Stabilization with Head Nod  - 1 x daily - 5 x weekly - 2 sets - 30 sec hold - Brandt-Daroff Vestibular Exercise  - 1 x daily - 5 x weekly - 2 sets - 3-5 reps - Romberg Stance with Eyes Closed  - 1 x daily - 5 x weekly - 2 sets - 30 sec hold  GOALS: Goals reviewed with patient? Yes  SHORT TERM GOALS: Target date: 02/18/2023  Patient to be independent with initial HEP. Baseline: HEP initiated Goal  status: IN PROGRESS    LONG TERM GOALS: Target date: 03/11/2023  Patient to be independent with advanced HEP. Baseline: Not yet initiated  Goal status: IN PROGRESS  Patient to report 0/10 dizziness with standing vertical and horizontal VOR for 30 seconds. Baseline: Unable Goal status: IN PROGRESS  Patient will report 0/10 dizziness with bed mobility.  Baseline: Symptomatic  Goal status: IN PROGRESS  Patient to demonstrate moderate sway with M-CTSIB condition with eyes closed/foam surface in order to improve safety in environments with uneven surfaces and dim lighting. Baseline: 10 sec with severe sway Goal status: IN PROGRESS  Patient to score at least 20/24 on DGI in order to decrease risk of falls. 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  Baseline: - Goal status: IN PROGRESS  Patient to score at least 60 on FOTO in order to indicate improved functional outcomes.  Baseline: 48 Goal status: IN PROGRESS    ASSESSMENT:  CLINICAL IMPRESSION: Patient arrived to session without changes in symptoms. Patient reported mild-moderate dizziness upon arrival. Standing multisensory balance activities did not increase sx with exception of vertical head nods with eyes closed. Gaze  stabilization tasks required corrective cueing for proper form and VOR was able to replicate dizziness, again worse in vertical plane. HEP was updated with notes for improved carryover of activities and additional exercise that brought on sx. No complaints at end of session.   OBJECTIVE IMPAIRMENTS: Abnormal gait, decreased activity tolerance, decreased balance, and dizziness.   ACTIVITY LIMITATIONS: carrying, lifting, bending, sitting, standing, squatting, stairs, transfers, bed mobility, bathing, toileting, dressing, reach over head, and hygiene/grooming  PARTICIPATION LIMITATIONS: meal prep, cleaning, laundry, driving, shopping, community activity, and church  PERSONAL FACTORS: Age, Past/current experiences, Time since onset of injury/illness/exacerbation, Transportation, and 3+ comorbidities: HTN, hip replacement, vestibular migraine; per pt- hx of polio and R LE is weaker  are also affecting patient's functional outcome.   REHAB POTENTIAL: Good  CLINICAL DECISION MAKING: Evolving/moderate complexity  EVALUATION COMPLEXITY: Moderate   PLAN:  PT FREQUENCY: 2x/week  PT DURATION: 6 weeks  PLANNED INTERVENTIONS: Therapeutic exercises, Therapeutic activity, Neuromuscular re-education, Balance training, Gait training, Patient/Family education, Self Care, Joint mobilization, Stair training, Vestibular training, Canalith repositioning, DME instructions, Dry Needling, Electrical stimulation, Cryotherapy, Moist heat, Taping, Manual therapy, and Re-evaluation  PLAN FOR NEXT SESSION: Review HEP; progress VOR and habituation     Anette Guarneri, PT, DPT 02/05/23 8:45 AM  [facility] at [facility] [address], [address] [place], [place] [address] Phone # [phone] Fax # [phone]

## 2023-02-05 ENCOUNTER — Ambulatory Visit: Payer: 59 | Admitting: Physical Therapy

## 2023-02-05 ENCOUNTER — Encounter: Payer: Self-pay | Admitting: Physical Therapy

## 2023-02-05 DIAGNOSIS — R42 Dizziness and giddiness: Secondary | ICD-10-CM | POA: Diagnosis not present

## 2023-02-05 DIAGNOSIS — R2681 Unsteadiness on feet: Secondary | ICD-10-CM

## 2023-02-06 NOTE — Therapy (Signed)
OUTPATIENT PHYSICAL THERAPY VESTIBULAR TREATMENT NOTE     Patient Name: Leonard Green MRN: [id number] DOB:10-16-1950, 72 y.o., male Today's Date: 02/10/2023  END OF SESSION:  PT End of Session - 02/10/23 0843     Visit Number 4    Number of Visits 13    Date for PT Re-Evaluation 03/11/23    Authorization Type [employer] Medicare    PT Start Time 0801    PT Stop Time 0842    PT Time Calculation (min) 41 min    Equipment Utilized During Treatment Gait belt    Activity Tolerance Patient tolerated treatment well    Behavior During Therapy WFL for tasks assessed/performed                Past Medical History:  Diagnosis Date   Hypertension    Past Surgical History:  Procedure Laterality Date   JOINT REPLACEMENT     hip   Patient Active Problem List   Diagnosis Date Noted   BPPV (benign paroxysmal positional vertigo), unspecified laterality 09/03/2022   Other dysphagia 07/18/2022   Hyperlipidemia 03/07/2022   Prediabetes 03/07/2022   Spondylosis without myelopathy or radiculopathy, cervical region 02/19/2022   Fracture of femoral neck, left (HCC) 05/21/2011    PCP: Rema Fendt, NP  REFERRING PROVIDER: Alphonzo Dublin, PA-C  REFERRING DIAG: R42 (ICD-10-CM) - Dizziness and giddiness  THERAPY DIAG:  Dizziness and giddiness  Unsteadiness on feet  ONSET DATE: 7 months  Rationale for Evaluation and Treatment: Rehabilitation  SUBJECTIVE:   SUBJECTIVE STATEMENT: "Weird." Sometimes when I do this (therapy) my dizziness is worse until I get home. Reports this is about 10 min and roommate is taking him to appointment. Reports dizziness at 3-4/10 dizziness at baseline.   Pt accompanied by: self  PERTINENT HISTORY: HTN, hip replacement, vestibular migraine; per pt- hx of polio and R LE is weaker  PAIN:  Are you having pain? No  PRECAUTIONS: Fall  RED FLAGS: None   WEIGHT BEARING RESTRICTIONS: No  FALLS: Has patient fallen in last 6 months?  No  LIVING ENVIRONMENT: Lives with:  friend Lives in: House/apartment Stairs:  3 steps to enter without rail; 1 story  Has following equipment at home: Single point cane  PLOF: Independent; retired  PATIENT GOALS: improve dizziness  OBJECTIVE:     TODAY'S TREATMENT: 02/10/23 Activity Comments  romberg EC + head nods 3x30 sec Cueing to increase cervical ROM ; c/o imbalance towards L side; c/o 6-7/10 dizziness.  HIT L positive, R negative  standing on foam + head turns/nods 2x30" each  With feet apart and together; significant L pulsion and moderate sway. With romberg head nods pt reports "that was a 10/10"  bending forward to place cone on floor, forward step over it; performed on each foot and backwards as well  More imbalance stabilizing on R LE; c/o 5/10 dizziness   1/2 turns to targets  Quick but small, multiple steps ; imbalance when trying to take larger steps      PATIENT EDUCATION: Education details: explained intended level/duration of symptoms after PT appointments, HEP update to be performed at counter for safety Person educated: Patient Education method: Explanation, Demonstration, Tactile cues, Verbal cues, and Handouts Education comprehension: verbalized understanding and returned demonstration    HOME EXERCISE PROGRAM Access Code: [facility] URL: [email or web address] Date: 02/10/2023 Prepared by: [facility] - Outpatient  Rehab - [facility]  Program Notes perform standing exercises at counter for safety  Exercises - Standing Gaze Stabilization  with Head Rotation  - 1 x daily - 5 x weekly - 2 sets - 30 sec hold - Standing Gaze Stabilization with Head Nod  - 1 x daily - 5 x weekly - 2 sets - 30 sec hold - Brandt-Daroff Vestibular Exercise  - 1 x daily - 5 x weekly - 2 sets - 3-5 reps - Wide Stance with Eyes Closed and Head Nods  - 1 x daily - 5 x weekly - 2-3 sets - 30 sec hold - Romberg Stance with Head Nods on Foam Pad  - 1 x daily - 5 x  weekly - 2 sets - 30 sec hold - Romberg Stance on Foam Pad with Head Rotation  - 1 x daily - 5 x weekly - 2 sets - 30 sec hold    -------------------------------------------------------- Objective measures taken below at initial evaluation:  DIAGNOSTIC FINDINGS: none recent  COGNITION: Overall cognitive status: Within functional limits for tasks assessed   SENSATION: Denies N/T in UEs/LEs  POSTURE:  rounded shoulders, forward head, and increased thoracic kyphosis  GAIT: Gait pattern:  limited B foot clearance, lateral trunk lean Assistive device utilized: None Level of assistance: Modified independence   PATIENT SURVEYS:  FOTO 48, 60  VESTIBULAR ASSESSMENT:  GENERAL OBSERVATION: pt wears readers   OCULOMOTOR EXAM:  Ocular Alignment:  slight R esotropia  Ocular ROM: No Limitations  Spontaneous Nystagmus: absent  Gaze-Induced Nystagmus: absent  Smooth Pursuits: intact  Saccades: intact  Convergence/Divergence: 5-6 cm    VESTIBULAR - OCULAR REFLEX:   Slow VOR: Comment: c/o mild dizziness vertical and visible retinal slip; intact horizontal  VOR Cancellation: Normal; c/o mild dizziness  Head-Impulse Test: HIT Right: negative HIT Left: positive     POSITIONAL TESTING:  Right Roll Test: negative Left Roll Test: negative   Right Sidelying: negative; c/o dizziness upon sitting up  Left Sidelying: negative; c/o dizziness upon sitting up     M-CTSIB  Condition 1: Firm Surface, EO 30 Sec, Mild Sway  Condition 2: Firm Surface, EC 30 Sec, Mild and Moderate Sway  Condition 3: Foam Surface, EO 30 Sec, Mild Sway  Condition 4: Foam Surface, EC ~10 Sec, Severe Sway       VESTIBULAR TREATMENT:                                                                                                   DATE: 01/28/23    PATIENT EDUCATION: Education details: prognosis, POC, HEP- with edu on safety and intended level of sx Person educated: Patient Education method:  Explanation, Demonstration, Tactile cues, Verbal cues, and Handouts Education comprehension: verbalized understanding  HOME EXERCISE PROGRAM: Access Code: [email or web address] URL: [email or web address] Date: 01/28/2023 Prepared by: [facility] - Outpatient  Rehab - [facility]  Exercises - Standing Gaze Stabilization with Head Rotation  - 1 x daily - 5 x weekly - 2 sets - 30 sec hold - Standing Gaze Stabilization with Head Nod  - 1 x daily - 5 x weekly - 2 sets - 30 sec hold - Brandt-Daroff Vestibular Exercise  - 1 x daily - 5  x weekly - 2 sets - 3-5 reps - Romberg Stance with Eyes Closed  - 1 x daily - 5 x weekly - 2 sets - 30 sec hold  GOALS: Goals reviewed with patient? Yes  SHORT TERM GOALS: Target date: 02/18/2023  Patient to be independent with initial HEP. Baseline: HEP initiated Goal status: MET 02/10/23    LONG TERM GOALS: Target date: 03/11/2023  Patient to be independent with advanced HEP. Baseline: Not yet initiated  Goal status: IN PROGRESS  Patient to report 0/10 dizziness with standing vertical and horizontal VOR for 30 seconds. Baseline: Unable Goal status: IN PROGRESS  Patient will report 0/10 dizziness with bed mobility.  Baseline: Symptomatic  Goal status: IN PROGRESS  Patient to demonstrate moderate sway with M-CTSIB condition with eyes closed/foam surface in order to improve safety in environments with uneven surfaces and dim lighting. Baseline: 10 sec with severe sway Goal status: IN PROGRESS  Patient to score at least 20/24 on DGI in order to decrease risk of falls. 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  Baseline: - Goal status: IN PROGRESS  Patient to score at least 60 on FOTO in order to indicate improved functional outcomes.  Baseline: 48 Goal status: IN PROGRESS    ASSESSMENT:  CLINICAL IMPRESSION: Patient arrived to  session with report of short duration of dizziness after PT appointments which resolve in about 10 minutes. Worked on review of multisensory activities from last session- this still brought on sx but appeared to be better-tolerated than before. HIT still positive on L side as seen on eval and L-pulsion is evident with activities. Bending habituation tasks revealed imbalance more so stabilizing on weaker R LE. Patient reported understanding of HEP update and without complaints upon leaving.    OBJECTIVE IMPAIRMENTS: Abnormal gait, decreased activity tolerance, decreased balance, and dizziness.   ACTIVITY LIMITATIONS: carrying, lifting, bending, sitting, standing, squatting, stairs, transfers, bed mobility, bathing, toileting, dressing, reach over head, and hygiene/grooming  PARTICIPATION LIMITATIONS: meal prep, cleaning, laundry, driving, shopping, community activity, and church  PERSONAL FACTORS: Age, Past/current experiences, Time since onset of injury/illness/exacerbation, Transportation, and 3+ comorbidities: HTN, hip replacement, vestibular migraine; per pt- hx of polio and R LE is weaker  are also affecting patient's functional outcome.   REHAB POTENTIAL: Good  CLINICAL DECISION MAKING: Evolving/moderate complexity  EVALUATION COMPLEXITY: Moderate   PLAN:  PT FREQUENCY: 2x/week  PT DURATION: 6 weeks  PLANNED INTERVENTIONS: Therapeutic exercises, Therapeutic activity, Neuromuscular re-education, Balance training, Gait training, Patient/Family education, Self Care, Joint mobilization, Stair training, Vestibular training, Canalith repositioning, DME instructions, Dry Needling, Electrical stimulation, Cryotherapy, Moist heat, Taping, Manual therapy, and Re-evaluation  PLAN FOR NEXT SESSION: progress VOR and habituation, SLS balance    Anette Guarneri, PT, DPT 02/10/23 8:44 AM  [facility] at [facility] [address] [place], [place]  [address] Phone # [phone] Fax # [phone]

## 2023-02-10 ENCOUNTER — Ambulatory Visit: Payer: 59 | Admitting: Physical Therapy

## 2023-02-10 ENCOUNTER — Encounter: Payer: Self-pay | Admitting: Physical Therapy

## 2023-02-10 DIAGNOSIS — R42 Dizziness and giddiness: Secondary | ICD-10-CM

## 2023-02-10 DIAGNOSIS — R2681 Unsteadiness on feet: Secondary | ICD-10-CM

## 2023-02-11 NOTE — Therapy (Signed)
OUTPATIENT PHYSICAL THERAPY VESTIBULAR TREATMENT NOTE     Patient Name: Leonard Green MRN: [id number] DOB:1951/07/14, 72 y.o., male Today's Date: 02/12/2023  END OF SESSION:  PT End of Session - 02/12/23 0842     Visit Number 5    Number of Visits 13    Date for PT Re-Evaluation 03/11/23    Authorization Type [employer] Medicare    PT Start Time 0800    PT Stop Time 0842    PT Time Calculation (min) 42 min    Equipment Utilized During Treatment Gait belt    Activity Tolerance Patient tolerated treatment well    Behavior During Therapy WFL for tasks assessed/performed                 Past Medical History:  Diagnosis Date   Hypertension    Past Surgical History:  Procedure Laterality Date   JOINT REPLACEMENT     hip   Patient Active Problem List   Diagnosis Date Noted   BPPV (benign paroxysmal positional vertigo), unspecified laterality 09/03/2022   Other dysphagia 07/18/2022   Hyperlipidemia 03/07/2022   Prediabetes 03/07/2022   Spondylosis without myelopathy or radiculopathy, cervical region 02/19/2022   Fracture of femoral neck, left (HCC) 05/21/2011    PCP: Rema Fendt, NP  REFERRING PROVIDER: Alphonzo Dublin, PA-C  REFERRING DIAG: R42 (ICD-10-CM) - Dizziness and giddiness  THERAPY DIAG:  Dizziness and giddiness  Unsteadiness on feet  ONSET DATE: 7 months  Rationale for Evaluation and Treatment: Rehabilitation  SUBJECTIVE:   SUBJECTIVE STATEMENT: Tried standing on foam at home and seemed to sway "crazy." Reports that he was using his chest of drawers for safety. Reports 2-3/10 dizziness at baseline today. Has been working on cutting trees with his brother and dizziness with that is "more than a 10."  Pt accompanied by: self  PERTINENT HISTORY: HTN, hip replacement, vestibular migraine; per pt- hx of polio and R LE is weaker  PAIN:  Are you having pain? No  PRECAUTIONS: Fall  RED FLAGS: None   WEIGHT BEARING RESTRICTIONS:  No  FALLS: Has patient fallen in last 6 months? No  LIVING ENVIRONMENT: Lives with:  friend Lives in: House/apartment Stairs:  3 steps to enter without rail; 1 story  Has following equipment at home: Single point cane  PLOF: Independent; retired  PATIENT GOALS: improve dizziness  OBJECTIVE:     TODAY'S TREATMENT: 02/12/23 Activity Comments  bending forward to place cone on floor, forward step over it. Then performed the reverse with each foot leading  CGA-min A for stability; reports mild brief dizziness upon stopping   Romberg foam head turns/nods 30" Mild-mod sway; good safety and no physical assist required   Gait with head turns/nods  8-9/10 dizziness with turns   4-6/10 dizziness with nods   Moderate imbalance and scissoring evident with turns>nods. Required CGA-min A and cues to slow down   Fwd/back stepping + head turns  5-7/10 dizziness and 1 UE support required on chair.   standing D2 flexion to cone on floor 2x5 each side CGA for safety      PATIENT EDUCATION: Education details: verbal review of HEP and update; discussion on pt's report of lack of progress and may refer back to MD if symptoms do not start improving going forward  Person educated: Patient Education method: Explanation, Demonstration, Tactile cues, Verbal cues, and Handouts Education comprehension: verbalized understanding and returned demonstration    HOME EXERCISE PROGRAM Access Code: [facility] URL: [email or web address] Date: 02/12/2023  Prepared by: [facility] - Outpatient  Rehab - [facility]  Program Notes perform standing exercises at counter for safety  Exercises - Standing Gaze Stabilization with Head Rotation  - 1 x daily - 5 x weekly - 2 sets - 30 sec hold - Standing Gaze Stabilization with Head Nod  - 1 x daily - 5 x weekly - 2 sets - 30 sec hold - Brandt-Daroff Vestibular Exercise  - 1 x daily - 5 x weekly - 2 sets - 3-5 reps - Wide Stance with Eyes Closed and  Head Nods  - 1 x daily - 5 x weekly - 2-3 sets - 30 sec hold - Romberg Stance with Head Nods on Foam Pad  - 1 x daily - 5 x weekly - 2 sets - 30 sec hold - Romberg Stance on Foam Pad with Head Rotation  - 1 x daily - 5 x weekly - 2 sets - 30 sec hold - Alternating Step Forward with Support  - 1 x daily - 5 x weekly - 2 sets - 10 reps    -------------------------------------------------------- Objective measures taken below at initial evaluation:  DIAGNOSTIC FINDINGS: none recent  COGNITION: Overall cognitive status: Within functional limits for tasks assessed   SENSATION: Denies N/T in UEs/LEs  POSTURE:  rounded shoulders, forward head, and increased thoracic kyphosis  GAIT: Gait pattern:  limited B foot clearance, lateral trunk lean Assistive device utilized: None Level of assistance: Modified independence   PATIENT SURVEYS:  FOTO 48, 60  VESTIBULAR ASSESSMENT:  GENERAL OBSERVATION: pt wears readers   OCULOMOTOR EXAM:  Ocular Alignment:  slight R esotropia  Ocular ROM: No Limitations  Spontaneous Nystagmus: absent  Gaze-Induced Nystagmus: absent  Smooth Pursuits: intact  Saccades: intact  Convergence/Divergence: 5-6 cm    VESTIBULAR - OCULAR REFLEX:   Slow VOR: Comment: c/o mild dizziness vertical and visible retinal slip; intact horizontal  VOR Cancellation: Normal; c/o mild dizziness  Head-Impulse Test: HIT Right: negative HIT Left: positive     POSITIONAL TESTING:  Right Roll Test: negative Left Roll Test: negative   Right Sidelying: negative; c/o dizziness upon sitting up  Left Sidelying: negative; c/o dizziness upon sitting up     M-CTSIB  Condition 1: Firm Surface, EO 30 Sec, Mild Sway  Condition 2: Firm Surface, EC 30 Sec, Mild and Moderate Sway  Condition 3: Foam Surface, EO 30 Sec, Mild Sway  Condition 4: Foam Surface, EC ~10 Sec, Severe Sway       VESTIBULAR TREATMENT:                                                                                                    DATE: 01/28/23    PATIENT EDUCATION: Education details: prognosis, POC, HEP- with edu on safety and intended level of sx Person educated: Patient Education method: Explanation, Demonstration, Tactile cues, Verbal cues, and Handouts Education comprehension: verbalized understanding  HOME EXERCISE PROGRAM: Access Code: [email or web address] URL: [email or web address] Date: 01/28/2023 Prepared by: [facility] - Outpatient  Rehab - [facility]  Exercises - Standing Gaze Stabilization with Head Rotation  -  1 x daily - 5 x weekly - 2 sets - 30 sec hold - Standing Gaze Stabilization with Head Nod  - 1 x daily - 5 x weekly - 2 sets - 30 sec hold - Brandt-Daroff Vestibular Exercise  - 1 x daily - 5 x weekly - 2 sets - 3-5 reps - Romberg Stance with Eyes Closed  - 1 x daily - 5 x weekly - 2 sets - 30 sec hold  GOALS: Goals reviewed with patient? Yes  SHORT TERM GOALS: Target date: 02/18/2023  Patient to be independent with initial HEP. Baseline: HEP initiated Goal status: MET 02/10/23    LONG TERM GOALS: Target date: 03/11/2023  Patient to be independent with advanced HEP. Baseline: Not yet initiated  Goal status: IN PROGRESS  Patient to report 0/10 dizziness with standing vertical and horizontal VOR for 30 seconds. Baseline: Unable Goal status: IN PROGRESS  Patient will report 0/10 dizziness with bed mobility.  Baseline: Symptomatic  Goal status: IN PROGRESS  Patient to demonstrate moderate sway with M-CTSIB condition with eyes closed/foam surface in order to improve safety in environments with uneven surfaces and dim lighting. Baseline: 10 sec with severe sway Goal status: IN PROGRESS  Patient to score at least 20/24 on DGI in order to decrease risk of falls. 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  Baseline: - Goal status: IN  PROGRESS  Patient to score at least 60 on FOTO in order to indicate improved functional outcomes.  Baseline: 48 Goal status: IN PROGRESS    ASSESSMENT:  CLINICAL IMPRESSION: Patient arrived to session with report of increased sway with balance activities on foam. Upon review of these activities, patient demonstrated mild-moderate sway and with good ability to utilize balance reflexes. Progression of balance and habituation activities revealed remaining imbalance with stepping over obstacles and gait with head movements. Because patient noted severe dizziness with gait with head turns, isolated stepping with head turns which was better-tolerated and appeared steadier. Spoke to patient about his perception of lack of progress with PT so far; advised him we may refer back to MD if symptoms do not start improving going forward. Patient agreeable with this plan and without complaints upon leaving.   OBJECTIVE IMPAIRMENTS: Abnormal gait, decreased activity tolerance, decreased balance, and dizziness.   ACTIVITY LIMITATIONS: carrying, lifting, bending, sitting, standing, squatting, stairs, transfers, bed mobility, bathing, toileting, dressing, reach over head, and hygiene/grooming  PARTICIPATION LIMITATIONS: meal prep, cleaning, laundry, driving, shopping, community activity, and church  PERSONAL FACTORS: Age, Past/current experiences, Time since onset of injury/illness/exacerbation, Transportation, and 3+ comorbidities: HTN, hip replacement, vestibular migraine; per pt- hx of polio and R LE is weaker  are also affecting patient's functional outcome.   REHAB POTENTIAL: Good  CLINICAL DECISION MAKING: Evolving/moderate complexity  EVALUATION COMPLEXITY: Moderate   PLAN:  PT FREQUENCY: 2x/week  PT DURATION: 6 weeks  PLANNED INTERVENTIONS: Therapeutic exercises, Therapeutic activity, Neuromuscular re-education, Balance training, Gait training, Patient/Family education, Self Care, Joint  mobilization, Stair training, Vestibular training, Canalith repositioning, DME instructions, Dry Needling, Electrical stimulation, Cryotherapy, Moist heat, Taping, Manual therapy, and Re-evaluation  PLAN FOR NEXT SESSION: progress VOR and habituation, SLS balance    Anette Guarneri, PT, DPT 02/12/23 8:44 AM  [facility] [address], [address] [place], [place] [address] Phone # [phone] Fax # [phone]

## 2023-02-12 ENCOUNTER — Encounter: Payer: Self-pay | Admitting: Physical Therapy

## 2023-02-12 ENCOUNTER — Ambulatory Visit: Payer: 59 | Admitting: Physical Therapy

## 2023-02-12 DIAGNOSIS — R2681 Unsteadiness on feet: Secondary | ICD-10-CM

## 2023-02-12 DIAGNOSIS — R42 Dizziness and giddiness: Secondary | ICD-10-CM

## 2023-02-12 NOTE — Therapy (Signed)
OUTPATIENT PHYSICAL THERAPY VESTIBULAR TREATMENT NOTE     Patient Name: Leonard Green MRN: [id number] DOB:Dec 07, 1950, 72 y.o., male Today's Date: 02/17/2023  END OF SESSION:  PT End of Session - 02/17/23 0842     Visit Number 6    Number of Visits 13    Date for PT Re-Evaluation 03/11/23    Authorization Type [employer] Medicare    PT Start Time 0803    PT Stop Time 0843    PT Time Calculation (min) 40 min    Equipment Utilized During Treatment Gait belt    Activity Tolerance Patient tolerated treatment well    Behavior During Therapy WFL for tasks assessed/performed                  Past Medical History:  Diagnosis Date   Hypertension    Past Surgical History:  Procedure Laterality Date   JOINT REPLACEMENT     hip   Patient Active Problem List   Diagnosis Date Noted   BPPV (benign paroxysmal positional vertigo), unspecified laterality 09/03/2022   Other dysphagia 07/18/2022   Hyperlipidemia 03/07/2022   Prediabetes 03/07/2022   Spondylosis without myelopathy or radiculopathy, cervical region 02/19/2022   Fracture of femoral neck, left (HCC) 05/21/2011    PCP: Rema Fendt, NP  REFERRING PROVIDER: Alphonzo Dublin, PA-C  REFERRING DIAG: R42 (ICD-10-CM) - Dizziness and giddiness  THERAPY DIAG:  Dizziness and giddiness  Unsteadiness on feet  ONSET DATE: 7 months  Rationale for Evaluation and Treatment: Rehabilitation  SUBJECTIVE:   SUBJECTIVE STATEMENT: Didn't do any yardwork this weekend. Dizziness is about the same, currently a little dizzy when he stands up. Reports that he walks 1/2 mile 2x a day and reports feeling of dizziness for about a minute afterwards.   Pt accompanied by: self  PERTINENT HISTORY: HTN, hip replacement, vestibular migraine; per pt- hx of polio and R LE is weaker  PAIN:  Are you having pain? No  PRECAUTIONS: Fall  RED FLAGS: None   WEIGHT BEARING RESTRICTIONS: No  FALLS: Has patient fallen in last 6  months? No  LIVING ENVIRONMENT: Lives with:  friend Lives in: House/apartment Stairs:  3 steps to enter without rail; 1 story  Has following equipment at home: Single point cane  PLOF: Independent; retired  PATIENT GOALS: improve dizziness  OBJECTIVE:     TODAY'S TREATMENT: 02/17/23 Activity Comments  gait + head turns/nods  4-7/10 dizziness with head turns and CGA-min A for stability; 5/10 dizziness with nods and CGA   fwd/back stepping + head nods/turns 10x each, each LE Cueing to reduce UE support to a few fingertips; c/o 5/10 dizziness ; better stability with L LE stepping    turns to targets   Then added: + alt toe tap on cone  C/o mild dizziness to R. Cueing to complete turns in 4 steps. More imbalance to R and moderate imbalance requiring min A   backwards walking 2x68ft Good stability   tandem walk 2x31ft CGA-Min A required d/t imbalance   wall bumps EC on foam 5x each  Limited control with shoulder strategy     PATIENT EDUCATION: Education details: POC and possibility of taking break from therapy with f/u with referring provider in the next couple visits if pt continues to report lack of progress  Person educated: Patient Education method: Explanation, Demonstration, Tactile cues, and Verbal cues Education comprehension: verbalized understanding    HOME EXERCISE PROGRAM Access Code: [facility] URL: [email or web address] Date: 02/12/2023 Prepared by: [facility] -  Outpatient  Rehab - [facility]  Program Notes perform standing exercises at counter for safety  Exercises - Standing Gaze Stabilization with Head Rotation  - 1 x daily - 5 x weekly - 2 sets - 30 sec hold - Standing Gaze Stabilization with Head Nod  - 1 x daily - 5 x weekly - 2 sets - 30 sec hold - Brandt-Daroff Vestibular Exercise  - 1 x daily - 5 x weekly - 2 sets - 3-5 reps - Wide Stance with Eyes Closed and Head Nods  - 1 x daily - 5 x weekly - 2-3 sets - 30 sec hold - Romberg  Stance with Head Nods on Foam Pad  - 1 x daily - 5 x weekly - 2 sets - 30 sec hold - Romberg Stance on Foam Pad with Head Rotation  - 1 x daily - 5 x weekly - 2 sets - 30 sec hold - Alternating Step Forward with Support  - 1 x daily - 5 x weekly - 2 sets - 10 reps    -------------------------------------------------------- Objective measures taken below at initial evaluation:  DIAGNOSTIC FINDINGS: none recent  COGNITION: Overall cognitive status: Within functional limits for tasks assessed   SENSATION: Denies N/T in UEs/LEs  POSTURE:  rounded shoulders, forward head, and increased thoracic kyphosis  GAIT: Gait pattern:  limited B foot clearance, lateral trunk lean Assistive device utilized: None Level of assistance: Modified independence   PATIENT SURVEYS:  FOTO 48, 60  VESTIBULAR ASSESSMENT:  GENERAL OBSERVATION: pt wears readers   OCULOMOTOR EXAM:  Ocular Alignment:  slight R esotropia  Ocular ROM: No Limitations  Spontaneous Nystagmus: absent  Gaze-Induced Nystagmus: absent  Smooth Pursuits: intact  Saccades: intact  Convergence/Divergence: 5-6 cm    VESTIBULAR - OCULAR REFLEX:   Slow VOR: Comment: c/o mild dizziness vertical and visible retinal slip; intact horizontal  VOR Cancellation: Normal; c/o mild dizziness  Head-Impulse Test: HIT Right: negative HIT Left: positive     POSITIONAL TESTING:  Right Roll Test: negative Left Roll Test: negative   Right Sidelying: negative; c/o dizziness upon sitting up  Left Sidelying: negative; c/o dizziness upon sitting up     M-CTSIB  Condition 1: Firm Surface, EO 30 Sec, Mild Sway  Condition 2: Firm Surface, EC 30 Sec, Mild and Moderate Sway  Condition 3: Foam Surface, EO 30 Sec, Mild Sway  Condition 4: Foam Surface, EC ~10 Sec, Severe Sway       VESTIBULAR TREATMENT:                                                                                                   DATE: 01/28/23    PATIENT  EDUCATION: Education details: prognosis, POC, HEP- with edu on safety and intended level of sx Person educated: Patient Education method: Explanation, Demonstration, Tactile cues, Verbal cues, and Handouts Education comprehension: verbalized understanding  HOME EXERCISE PROGRAM: Access Code: [email or web address] URL: [email or web address] Date: 01/28/2023 Prepared by: [facility] - Outpatient  Rehab - [facility]  Exercises - Standing Gaze Stabilization with Head Rotation  - 1 x daily -  5 x weekly - 2 sets - 30 sec hold - Standing Gaze Stabilization with Head Nod  - 1 x daily - 5 x weekly - 2 sets - 30 sec hold - Brandt-Daroff Vestibular Exercise  - 1 x daily - 5 x weekly - 2 sets - 3-5 reps - Romberg Stance with Eyes Closed  - 1 x daily - 5 x weekly - 2 sets - 30 sec hold  GOALS: Goals reviewed with patient? Yes  SHORT TERM GOALS: Target date: 02/18/2023  Patient to be independent with initial HEP. Baseline: HEP initiated Goal status: MET 02/10/23    LONG TERM GOALS: Target date: 03/11/2023  Patient to be independent with advanced HEP. Baseline: Not yet initiated  Goal status: IN PROGRESS  Patient to report 0/10 dizziness with standing vertical and horizontal VOR for 30 seconds. Baseline: Unable Goal status: IN PROGRESS  Patient will report 0/10 dizziness with bed mobility.  Baseline: Symptomatic  Goal status: IN PROGRESS  Patient to demonstrate moderate sway with M-CTSIB condition with eyes closed/foam surface in order to improve safety in environments with uneven surfaces and dim lighting. Baseline: 10 sec with severe sway Goal status: IN PROGRESS  Patient to score at least 20/24 on DGI in order to decrease risk of falls. 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  Baseline: - Goal status: IN PROGRESS  Patient to score at least 60 on FOTO in order to  indicate improved functional outcomes.  Baseline: 48 Goal status: IN PROGRESS    ASSESSMENT:  CLINICAL IMPRESSION: Patient arrived to session without change in symptoms. Continued with dynamic balance activities with body turns and head movements. Patient still reports moderate-severe dizziness with these tasks and mild-moderate sway requiring at least CGA. Patient tolerates all activities well and upon discussion with patient, does not exhibit fear avoidance activities that would exacerbate his condition. Encouraged patient to continue daily walks and continuing physical activity. No complaints at end of session.   OBJECTIVE IMPAIRMENTS: Abnormal gait, decreased activity tolerance, decreased balance, and dizziness.   ACTIVITY LIMITATIONS: carrying, lifting, bending, sitting, standing, squatting, stairs, transfers, bed mobility, bathing, toileting, dressing, reach over head, and hygiene/grooming  PARTICIPATION LIMITATIONS: meal prep, cleaning, laundry, driving, shopping, community activity, and church  PERSONAL FACTORS: Age, Past/current experiences, Time since onset of injury/illness/exacerbation, Transportation, and 3+ comorbidities: HTN, hip replacement, vestibular migraine; per pt- hx of polio and R LE is weaker  are also affecting patient's functional outcome.   REHAB POTENTIAL: Good  CLINICAL DECISION MAKING: Evolving/moderate complexity  EVALUATION COMPLEXITY: Moderate   PLAN:  PT FREQUENCY: 2x/week  PT DURATION: 6 weeks  PLANNED INTERVENTIONS: Therapeutic exercises, Therapeutic activity, Neuromuscular re-education, Balance training, Gait training, Patient/Family education, Self Care, Joint mobilization, Stair training, Vestibular training, Canalith repositioning, DME instructions, Dry Needling, Electrical stimulation, Cryotherapy, Moist heat, Taping, Manual therapy, and Re-evaluation  PLAN FOR NEXT SESSION: talk about break from therapy if pt continues to report little  improvement; progress VOR and habituation, SLS balance    Anette Guarneri, PT, DPT 02/17/23 8:44 AM  [facility] [address], [address] [place], [place] [address] Phone # [phone] Fax # [phone]

## 2023-02-17 ENCOUNTER — Ambulatory Visit: Payer: 59 | Attending: Anesthesiology | Admitting: Physical Therapy

## 2023-02-17 ENCOUNTER — Encounter: Payer: Self-pay | Admitting: Physical Therapy

## 2023-02-17 DIAGNOSIS — R42 Dizziness and giddiness: Secondary | ICD-10-CM | POA: Insufficient documentation

## 2023-02-17 DIAGNOSIS — R2681 Unsteadiness on feet: Secondary | ICD-10-CM | POA: Insufficient documentation

## 2023-02-19 ENCOUNTER — Encounter: Payer: Self-pay | Admitting: Physical Therapy

## 2023-02-19 ENCOUNTER — Ambulatory Visit: Payer: 59 | Admitting: Physical Therapy

## 2023-02-19 DIAGNOSIS — R42 Dizziness and giddiness: Secondary | ICD-10-CM

## 2023-02-19 DIAGNOSIS — R2681 Unsteadiness on feet: Secondary | ICD-10-CM

## 2023-02-19 NOTE — Therapy (Signed)
OUTPATIENT PHYSICAL THERAPY VESTIBULAR TREATMENT NOTE     Patient Name: Leonard Green MRN: [id number] DOB:Mar 06, 1951, 72 y.o., male Today's Date: 02/19/2023  END OF SESSION:  PT End of Session - 02/19/23 0801     Visit Number 7    Number of Visits 13    Date for PT Re-Evaluation 03/11/23    Authorization Type [employer] Medicare    PT Start Time 0806    PT Stop Time 0840    PT Time Calculation (min) 34 min    Equipment Utilized During Treatment Gait belt    Activity Tolerance Patient tolerated treatment well    Behavior During Therapy WFL for tasks assessed/performed                   Past Medical History:  Diagnosis Date   Hypertension    Past Surgical History:  Procedure Laterality Date   JOINT REPLACEMENT     hip   Patient Active Problem List   Diagnosis Date Noted   BPPV (benign paroxysmal positional vertigo), unspecified laterality 09/03/2022   Other dysphagia 07/18/2022   Hyperlipidemia 03/07/2022   Prediabetes 03/07/2022   Spondylosis without myelopathy or radiculopathy, cervical region 02/19/2022   Fracture of femoral neck, left (HCC) 05/21/2011    PCP: Rema Fendt, NP  REFERRING PROVIDER: Alphonzo Dublin, PA-C  REFERRING DIAG: R42 (ICD-10-CM) - Dizziness and giddiness  THERAPY DIAG:  Dizziness and giddiness  Unsteadiness on feet  ONSET DATE: 7 months  Rationale for Evaluation and Treatment: Rehabilitation  SUBJECTIVE:   SUBJECTIVE STATEMENT: Feel like there is no improvement, none.  I'm okay when I'm sitting, and it mainly comes on when I stand and walk.  The spinning dizziness is gone, but the dizziness in my head is still there.  Pt accompanied by: self  PERTINENT HISTORY: HTN, hip replacement, vestibular migraine; per pt- hx of polio and R LE is weaker  PAIN:  Are you having pain? No  PRECAUTIONS: Fall  RED FLAGS: None   WEIGHT BEARING RESTRICTIONS: No  FALLS: Has patient fallen in last 6 months? No  LIVING  ENVIRONMENT: Lives with:  friend Lives in: House/apartment Stairs:  3 steps to enter without rail; 1 story  Has following equipment at home: Single point cane  PLOF: Independent; retired  PATIENT GOALS: improve dizziness  OBJECTIVE:    TODAY'S TREATMENT: 02/19/2023 Activity Comments  Reviewed full HEP-see below -standing gaze stabilization  -rates 4-5/10 dizziness horizontal, 2-3/10 vertical -Pt performs exercises well  Short distance gait with turns, slowed pace to reset Cues for use of visual target for improved stability.  Gait outdoors on incline/decline Supervision, cues for posture, heelstrike on decline of hill for improved stability               PATIENT EDUCATION: Education details: Discussed POC and possibility of taking break from therapy (x 2 weeks); discussed long term consistent use of Meclizine and pt's lessened response at this time to habituation exercises.  Recommend f/u with referring provider   Person educated: Patient Education method: Explanation, Demonstration, Tactile cues, and Verbal cues Education comprehension: verbalized understanding    HOME EXERCISE PROGRAM Access Code: [facility] URL: [email or web address] Date: 02/12/2023 Prepared by: [facility] - Outpatient  Rehab - [facility]  Program Notes perform standing exercises at counter for safety  Exercises - Standing Gaze Stabilization with Head Rotation  - 1 x daily - 5 x weekly - 2 sets - 30 sec hold - Standing Gaze Stabilization with Head  Nod  - 1 x daily - 5 x weekly - 2 sets - 30 sec hold - Brandt-Daroff Vestibular Exercise  - 1 x daily - 5 x weekly - 2 sets - 3-5 reps - Wide Stance with Eyes Closed and Head Nods  - 1 x daily - 5 x weekly - 2-3 sets - 30 sec hold - Romberg Stance with Head Nods on Foam Pad  - 1 x daily - 5 x weekly - 2 sets - 30 sec hold - Romberg Stance on Foam Pad with Head Rotation  - 1 x daily - 5 x weekly - 2 sets - 30 sec hold - Alternating Step  Forward with Support  - 1 x daily - 5 x weekly - 2 sets - 10 reps    -------------------------------------------------------- Objective measures taken below at initial evaluation:  DIAGNOSTIC FINDINGS: none recent  COGNITION: Overall cognitive status: Within functional limits for tasks assessed   SENSATION: Denies N/T in UEs/LEs  POSTURE:  rounded shoulders, forward head, and increased thoracic kyphosis  GAIT: Gait pattern:  limited B foot clearance, lateral trunk lean Assistive device utilized: None Level of assistance: Modified independence   PATIENT SURVEYS:  FOTO 48, 60  VESTIBULAR ASSESSMENT:  GENERAL OBSERVATION: pt wears readers   OCULOMOTOR EXAM:  Ocular Alignment:  slight R esotropia  Ocular ROM: No Limitations  Spontaneous Nystagmus: absent  Gaze-Induced Nystagmus: absent  Smooth Pursuits: intact  Saccades: intact  Convergence/Divergence: 5-6 cm    VESTIBULAR - OCULAR REFLEX:   Slow VOR: Comment: c/o mild dizziness vertical and visible retinal slip; intact horizontal  VOR Cancellation: Normal; c/o mild dizziness  Head-Impulse Test: HIT Right: negative HIT Left: positive     POSITIONAL TESTING:  Right Roll Test: negative Left Roll Test: negative   Right Sidelying: negative; c/o dizziness upon sitting up  Left Sidelying: negative; c/o dizziness upon sitting up     M-CTSIB  Condition 1: Firm Surface, EO 30 Sec, Mild Sway  Condition 2: Firm Surface, EC 30 Sec, Mild and Moderate Sway  Condition 3: Foam Surface, EO 30 Sec, Mild Sway  Condition 4: Foam Surface, EC ~10 Sec, Severe Sway       VESTIBULAR TREATMENT:                                                                                                   DATE: 01/28/23    PATIENT EDUCATION: Education details: prognosis, POC, HEP- with edu on safety and intended level of sx Person educated: Patient Education method: Explanation, Demonstration, Tactile cues, Verbal cues, and  Handouts Education comprehension: verbalized understanding  HOME EXERCISE PROGRAM: Access Code: [email or web address] URL: [email or web address] Date: 01/28/2023 Prepared by: [facility] - Outpatient  Rehab - [facility]  Exercises - Standing Gaze Stabilization with Head Rotation  - 1 x daily - 5 x weekly - 2 sets - 30 sec hold - Standing Gaze Stabilization with Head Nod  - 1 x daily - 5 x weekly - 2 sets - 30 sec hold - Brandt-Daroff Vestibular Exercise  - 1 x daily - 5 x weekly - 2  sets - 3-5 reps - Romberg Stance with Eyes Closed  - 1 x daily - 5 x weekly - 2 sets - 30 sec hold  GOALS: Goals reviewed with patient? Yes  SHORT TERM GOALS: Target date: 02/18/2023  Patient to be independent with initial HEP. Baseline: HEP initiated Goal status: MET 02/10/23    LONG TERM GOALS: Target date: 03/11/2023  Patient to be independent with advanced HEP. Baseline: Not yet initiated  Goal status: IN PROGRESS  Patient to report 0/10 dizziness with standing vertical and horizontal VOR for 30 seconds. Baseline: Unable; rates 2-3/10 vertical; 4-5/10 horizontal 02/19/2023 Goal status: IN PROGRESS  Patient will report 0/10 dizziness with bed mobility.  Baseline: Symptomatic , rates 5/10 02/19/2023 Goal status: IN PROGRESS  Patient to demonstrate moderate sway with M-CTSIB condition with eyes closed/foam surface in order to improve safety in environments with uneven surfaces and dim lighting. Baseline: 10 sec with severe sway, 02/19/2023 Mod sway for 30 seconds Goal status: MET 02/19/2023  Patient to score at least 20/24 on DGI in order to decrease risk of falls. 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  Baseline: - Goal status: IN PROGRESS  Patient to score at least 60 on FOTO in order to indicate improved functional outcomes.  Baseline: 48 Goal status: IN PROGRESS     ASSESSMENT:  CLINICAL IMPRESSION: Assessed LTGs this visit, with pt meeting LTG 4 for improved balance with Condition 4 on MCTSIB.  Pt continues to report 4-5/10 dizziness with head motions and with gait.  Discussed safety with gait, to slow pace with gait and to use visual targets for improved stability.  Reviewed HEP and pt return demo understanding.  He does continue to rate dizziness with VOR and with standing positions head motions.  Per discussion last visit, pt is agreement with holding therapy for several weeks for him to follow up with MD due to lack of progress towards goals of decreased dizziness.   OBJECTIVE IMPAIRMENTS: Abnormal gait, decreased activity tolerance, decreased balance, and dizziness.   ACTIVITY LIMITATIONS: carrying, lifting, bending, sitting, standing, squatting, stairs, transfers, bed mobility, bathing, toileting, dressing, reach over head, and hygiene/grooming  PARTICIPATION LIMITATIONS: meal prep, cleaning, laundry, driving, shopping, community activity, and church  PERSONAL FACTORS: Age, Past/current experiences, Time since onset of injury/illness/exacerbation, Transportation, and 3+ comorbidities: HTN, hip replacement, vestibular migraine; per pt- hx of polio and R LE is weaker  are also affecting patient's functional outcome.   REHAB POTENTIAL: Good  CLINICAL DECISION MAKING: Evolving/moderate complexity  EVALUATION COMPLEXITY: Moderate   PLAN:  PT FREQUENCY: 2x/week  PT DURATION: 6 weeks  PLANNED INTERVENTIONS: Therapeutic exercises, Therapeutic activity, Neuromuscular re-education, Balance training, Gait training, Patient/Family education, Self Care, Joint mobilization, Stair training, Vestibular training, Canalith repositioning, DME instructions, Dry Needling, Electrical stimulation, Cryotherapy, Moist heat, Taping, Manual therapy, and Re-evaluation  PLAN FOR NEXT SESSION: Pt to return last week of August to reassess goals and discuss POC.  He is to  follow up with referring MD during that time.      Lonia Blood, PT 02/19/23 8:44 AM Phone: [phone] Fax: [phone]  [facility] Rehab at [facility] [address] [place], [address] [place], [place] [address] Phone # [phone] Fax # [phone]

## 2023-02-24 ENCOUNTER — Ambulatory Visit: Payer: 59 | Admitting: Physical Therapy

## 2023-02-26 ENCOUNTER — Ambulatory Visit: Payer: 59 | Admitting: Physical Therapy

## 2023-03-03 ENCOUNTER — Ambulatory Visit: Payer: 59 | Admitting: Physical Therapy

## 2023-03-05 ENCOUNTER — Ambulatory Visit: Payer: 59 | Admitting: Physical Therapy

## 2023-03-10 ENCOUNTER — Ambulatory Visit: Payer: 59 | Admitting: Physical Therapy

## 2023-03-10 ENCOUNTER — Encounter: Payer: Self-pay | Admitting: Physical Therapy

## 2023-03-10 DIAGNOSIS — R42 Dizziness and giddiness: Secondary | ICD-10-CM

## 2023-03-10 DIAGNOSIS — R2681 Unsteadiness on feet: Secondary | ICD-10-CM

## 2023-03-10 NOTE — Therapy (Signed)
OUTPATIENT PHYSICAL THERAPY VESTIBULAR TREATMENT NOTE/DISCHARGE SUMMARY     Patient Name: Leonard Green MRN: [id number] DOB:[birth year], 72 y.o., male Today's Date: 03/10/2023   PHYSICAL THERAPY DISCHARGE SUMMARY  Visits from Start of Care: 8  Current functional level related to goals / functional outcomes: See LTGs below.     Remaining deficits: Resolved spinning dizziness, but pt continues to describe dizziness/unsteadiness/motion sensitivity that has not improved   Education / Equipment: Educated in [employer] and pt return demo this.   Patient agrees to discharge. Patient goals were partially met. Patient is being discharged due to lack of progress.  Pt continues to report dizziness and unsteadiness and PT recommends follow up with ENT.  END OF SESSION:  PT End of Session - 03/10/23 0801     Visit Number 8    Number of Visits 13    Date for PT Re-Evaluation 03/11/23    Authorization Type [employer] Medicare    PT Start Time 0803    PT Stop Time 0833    PT Time Calculation (min) 30 min    Equipment Utilized During Treatment Gait belt    Activity Tolerance Patient tolerated treatment well    Behavior During Therapy WFL for tasks assessed/performed                    Past Medical History:  Diagnosis Date   Hypertension    Past Surgical History:  Procedure Laterality Date   JOINT REPLACEMENT     hip   Patient Active Problem List   Diagnosis Date Noted   BPPV (benign paroxysmal positional vertigo), unspecified laterality 09/03/2022   Other dysphagia 07/18/2022   Hyperlipidemia 03/07/2022   Prediabetes 03/07/2022   Spondylosis without myelopathy or radiculopathy, cervical region 02/19/2022   Fracture of femoral neck, left (HCC) 05/21/2011    PCP: Rema Fendt, NP  REFERRING PROVIDER: Alphonzo Dublin, PA-C  REFERRING DIAG: R42 (ICD-10-CM) - Dizziness and giddiness  THERAPY DIAG:  Dizziness and giddiness  Unsteadiness on feet  ONSET DATE: 7  months  Rationale for Evaluation and Treatment: Rehabilitation  SUBJECTIVE:   SUBJECTIVE STATEMENT: Will plan to go back to the doctor (ENT) once I finish PT.  Not spinning anymore.  Really just happens when I stand up, it feels like my head is going side to side.  Continued to take 3 pills of Meclizine daily.  Pt accompanied by: self  PERTINENT HISTORY: HTN, hip replacement, vestibular migraine; per pt- hx of polio and R LE is weaker  PAIN:  Are you having pain? No  PRECAUTIONS: Fall  RED FLAGS: None   WEIGHT BEARING RESTRICTIONS: No  FALLS: Has patient fallen in last 6 months? No  LIVING ENVIRONMENT: Lives with:  friend Lives in: House/apartment Stairs:  3 steps to enter without rail; 1 story  Has following equipment at home: Single point cane  PLOF: Independent; retired  PATIENT GOALS: improve dizziness  OBJECTIVE:    TODAY'S TREATMENT: 03/10/2023 Activity Comments  Standing VOR horizontal x 30 sec 2-3/10 symtpoms  Standing VOR vertical x 30 sec 2-3/10 symptoms  DGI 21/24 Improve from 18/24  FOTO status score 45 Decreased from initial score 48          [facility] PT Assessment - 03/10/23 0815       Standardized Balance Assessment   Standardized Balance Assessment Dynamic Gait Index      Dynamic Gait Index   Level Surface Normal    Change in Gait Speed Normal  Gait with Horizontal Head Turns Mild Impairment   reports increase in symptoms   Gait with Vertical Head Turns Normal    Gait and Pivot Turn Normal    Step Over Obstacle Mild Impairment    Step Around Obstacles Normal    Steps Mild Impairment    Total Score 21    DGI comment: improved from 18/24               PATIENT EDUCATION: Education details: Discussed POC, progress towards objective goals but continued dizziness with standing and no reported change in symptoms per patient.  PT recommends follow up with ENT and ask questions about long term Meclizine use/vestibular system use for  balance.   Discussed discharge from PT this visit and pt in agreement. Person educated: Patient Education method: Explanation, Demonstration, Tactile cues, and Verbal cues Education comprehension: verbalized understanding    HOME EXERCISE PROGRAM Access Code: [facility] URL: [email or web address] Date: 02/12/2023 Prepared by: [facility] - Outpatient  Rehab - [facility]  Program Notes perform standing exercises at counter for safety  Exercises - Standing Gaze Stabilization with Head Rotation  - 1 x daily - 5 x weekly - 2 sets - 30 sec hold - Standing Gaze Stabilization with Head Nod  - 1 x daily - 5 x weekly - 2 sets - 30 sec hold - Brandt-Daroff Vestibular Exercise  - 1 x daily - 5 x weekly - 2 sets - 3-5 reps - Wide Stance with Eyes Closed and Head Nods  - 1 x daily - 5 x weekly - 2-3 sets - 30 sec hold - Romberg Stance with Head Nods on Foam Pad  - 1 x daily - 5 x weekly - 2 sets - 30 sec hold - Romberg Stance on Foam Pad with Head Rotation  - 1 x daily - 5 x weekly - 2 sets - 30 sec hold - Alternating Step Forward with Support  - 1 x daily - 5 x weekly - 2 sets - 10 reps    -------------------------------------------------------- Objective measures taken below at initial evaluation:  DIAGNOSTIC FINDINGS: none recent  COGNITION: Overall cognitive status: Within functional limits for tasks assessed   SENSATION: Denies N/T in UEs/LEs  POSTURE:  rounded shoulders, forward head, and increased thoracic kyphosis  GAIT: Gait pattern:  limited B foot clearance, lateral trunk lean Assistive device utilized: None Level of assistance: Modified independence   PATIENT SURVEYS:  FOTO 48, 60  VESTIBULAR ASSESSMENT:  GENERAL OBSERVATION: pt wears readers   OCULOMOTOR EXAM:  Ocular Alignment:  slight R esotropia  Ocular ROM: No Limitations  Spontaneous Nystagmus: absent  Gaze-Induced Nystagmus: absent  Smooth Pursuits: intact  Saccades:  intact  Convergence/Divergence: 5-6 cm    VESTIBULAR - OCULAR REFLEX:   Slow VOR: Comment: c/o mild dizziness vertical and visible retinal slip; intact horizontal  VOR Cancellation: Normal; c/o mild dizziness  Head-Impulse Test: HIT Right: negative HIT Left: positive     POSITIONAL TESTING:  Right Roll Test: negative Left Roll Test: negative   Right Sidelying: negative; c/o dizziness upon sitting up  Left Sidelying: negative; c/o dizziness upon sitting up     M-CTSIB  Condition 1: Firm Surface, EO 30 Sec, Mild Sway  Condition 2: Firm Surface, EC 30 Sec, Mild and Moderate Sway  Condition 3: Foam Surface, EO 30 Sec, Mild Sway  Condition 4: Foam Surface, EC ~10 Sec, Severe Sway       VESTIBULAR TREATMENT:  DATE: 01/28/23    PATIENT EDUCATION: Education details: prognosis, POC, HEP- with edu on safety and intended level of sx Person educated: Patient Education method: Explanation, Demonstration, Tactile cues, Verbal cues, and Handouts Education comprehension: verbalized understanding  HOME EXERCISE PROGRAM: Access Code: [email or web address] URL: [email or web address] Date: 01/28/2023 Prepared by: [facility] - Outpatient  Rehab - [facility]  Exercises - Standing Gaze Stabilization with Head Rotation  - 1 x daily - 5 x weekly - 2 sets - 30 sec hold - Standing Gaze Stabilization with Head Nod  - 1 x daily - 5 x weekly - 2 sets - 30 sec hold - Brandt-Daroff Vestibular Exercise  - 1 x daily - 5 x weekly - 2 sets - 3-5 reps - Romberg Stance with Eyes Closed  - 1 x daily - 5 x weekly - 2 sets - 30 sec hold  GOALS: Goals reviewed with patient? Yes  SHORT TERM GOALS: Target date: 02/18/2023  Patient to be independent with initial HEP. Baseline: HEP initiated Goal status: MET 02/10/23    LONG TERM GOALS: Target date: 03/11/2023  Patient to be independent with  advanced HEP. Baseline: Not yet initiated  Goal status: MET, per pt report and previous check 03/10/2023  Patient to report 0/10 dizziness with standing vertical and horizontal VOR for 30 seconds. Baseline: Unable; rates 2-3/10 vertical; 4-5/10 horizontal 02/19/2023; 2-3/10 03/10/2023 Goal status: NOT MET  Patient will report 0/10 dizziness with bed mobility.  Baseline: Symptomatic , rates 5/10 02/19/2023; 0/10 with rolling, reports dizziness upon trying to get up from sit>stand Goal status: PARTIALLY MET, 03/10/2023  Patient to demonstrate moderate sway with M-CTSIB condition with eyes closed/foam surface in order to improve safety in environments with uneven surfaces and dim lighting. Baseline: 10 sec with severe sway, 02/19/2023 Mod sway for 30 seconds Goal status: MET 02/19/2023  Patient to score at least 20/24 on DGI in order to decrease risk of falls. 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  Baseline: -reports 50-60% improvement Goal status: MET 03/10/2023  Patient to score at least 60 on FOTO in order to indicate improved functional outcomes.  Baseline: 48>45 03/10/2023 Goal status: NOT MET 03/10/2023    ASSESSMENT:  CLINICAL IMPRESSION: Pt has met 4 of 7 LTGs.  He demo improved scores on MCTSIB and DGI compared to first assessment.  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  However, he continues to report unsteadiness and dizziness in his head anytime he is on his feet.  FOTO score has decreased from 48>45, and pt continues to report motion sensitivity with head turns, bending, quick motions.  He would like to follow up with ENT, as overall symptoms have not improved per patient report.  He is agreeable to discharge this visit. OBJECTIVE IMPAIRMENTS: Abnormal gait, decreased activity tolerance, decreased balance, and dizziness.  ACTIVITY  LIMITATIONS: carrying, lifting, bending, sitting, standing, squatting, stairs, transfers, bed mobility, bathing, toileting, dressing, reach over head, and hygiene/grooming  PARTICIPATION LIMITATIONS: meal prep, cleaning, laundry, driving, shopping, community activity, and church  PERSONAL FACTORS: Age, Past/current experiences, Time since onset of injury/illness/exacerbation, Transportation, and 3+ comorbidities: HTN, hip replacement, vestibular migraine; per pt- hx of polio and R LE is weaker  are also affecting patient's functional outcome.   REHAB POTENTIAL: Good  CLINICAL DECISION MAKING: Evolving/moderate complexity  EVALUATION COMPLEXITY: Moderate   PLAN:  PT FREQUENCY: 2x/week  PT DURATION: 6  weeks  PLANNED INTERVENTIONS: Therapeutic exercises, Therapeutic activity, Neuromuscular re-education, Balance training, Gait training, Patient/Family education, Self Care, Joint mobilization, Stair training, Vestibular training, Canalith repositioning, DME instructions, Dry Needling, Electrical stimulation, Cryotherapy, Moist heat, Taping, Manual therapy, and Re-evaluation  PLAN FOR NEXT SESSION: Dsicharge this visit.     Lonia Blood, PT 03/10/23 8:35 AM Phone: [phone] Fax: [phone]  [facility] Outpatient Rehab at [facility] [address] [place], [address] [place], [place] [address] Phone # [phone] Fax # [phone]

## 2023-03-12 ENCOUNTER — Ambulatory Visit: Payer: 59 | Admitting: Physical Therapy

## 2023-03-19 ENCOUNTER — Encounter: Payer: Self-pay | Admitting: Cardiovascular Disease

## 2023-03-19 ENCOUNTER — Ambulatory Visit: Payer: 59 | Attending: Cardiovascular Disease | Admitting: Cardiovascular Disease

## 2023-03-19 VITALS — BP 136/79 | HR 69 | Ht 65.0 in | Wt 157.0 lb

## 2023-03-19 DIAGNOSIS — E7800 Pure hypercholesterolemia, unspecified: Secondary | ICD-10-CM | POA: Diagnosis not present

## 2023-03-19 DIAGNOSIS — R9431 Abnormal electrocardiogram [ECG] [EKG]: Secondary | ICD-10-CM

## 2023-03-19 DIAGNOSIS — E785 Hyperlipidemia, unspecified: Secondary | ICD-10-CM

## 2023-03-19 DIAGNOSIS — R42 Dizziness and giddiness: Secondary | ICD-10-CM | POA: Diagnosis not present

## 2023-03-19 DIAGNOSIS — I10 Essential (primary) hypertension: Secondary | ICD-10-CM

## 2023-03-19 LAB — LIPID PANEL
Chol/HDL Ratio: 2.5 ratio (ref 0.0–5.0)
Cholesterol, Total: 154 mg/dL (ref 100–199)
HDL: 62 mg/dL (ref >39)
LDL Chol Calc (NIH): 81 mg/dL (ref 0–99)
Triglycerides: 51 mg/dL (ref 0–149)
VLDL Cholesterol Cal: 11 mg/dL (ref 5–40)

## 2023-03-19 NOTE — Patient Instructions (Signed)
Medication Instructions:  No changes *If you need a refill on your cardiac medications before your next appointment, please call your pharmacy*   Lab Work: Lipid panel If you have labs (blood work) drawn today and your tests are completely normal, you will receive your results only by: MyChart Message (if you have MyChart) OR A paper copy in the mail If you have any lab test that is abnormal or we need to change your treatment, we will call you to review the results.   Testing/Procedures: Your physician has requested that you have a carotid duplex. This test is an ultrasound of the carotid arteries in your neck. It looks at blood flow through these arteries that supply the brain with blood. Allow one hour for this exam. There are no restrictions or special instructions.    Follow-Up: At [facility] you and your health needs are our priority.  As part of our continuing mission to provide you with exceptional heart care, we have created designated Provider Care Teams.  These Care Teams include your primary Cardiologist (physician) and Advanced Practice Providers (APPs -  Physician Assistants and Nurse Practitioners) who all work together to provide you with the care you need, when you need it.  We recommend signing up for the patient portal called "MyChart".  Sign up information is provided on this After Visit Summary.  MyChart is used to connect with patients for Virtual Visits (Telemedicine).  Patients are able to view lab/test results, encounter notes, upcoming appointments, etc.  Non-urgent messages can be sent to your provider as well.   To learn more about what you can do with MyChart, go to [email or web address].    Your next appointment:   1 year(s)  Provider:   Thurmon Fair, MD

## 2023-03-19 NOTE — Progress Notes (Signed)
Cardiology Office Note:    Date:  03/23/2023   ID:  Leonard Green, DOB 03-27-1951, MRN [id number]  PCP:  Leonard Fendt, NP   [facility] Providers Cardiologist:  Leonard Fair, MD     Referring MD: Leonard Fendt, NP   Chief Complaint  Patient presents with   Hyperlipidemia     History of Present Illness:    Leonard Green is a 72 y.o. 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  He does not have a history of coronary disease, congestive heart failure, valvular heart disease, diabetes mellitus, stroke/TIA or other serious chronic illnesses.  His biggest complaint is dizziness.  This has been going on for a very long time and has not yet been adequately resolved.  He last required an emergency room evaluation for this 08/02/2022.  He is taking meclizine 75 mg several times a day, without significant improvement.  He becomes dizzy if he looks sideways, in either direction.  Also becomes dizzy when he stands up.  He denies shortness of breath or chest pain at rest or with activity, orthopnea, PND, lower extremity edema, palpitations, syncope, falls, claudication or other neurological complaints.  His blood pressure is now adequately controlled on valsartan.  He remains physically active despite his problems with dizziness.  He is borderline overweight.  His ECG has at times shown right bundle branch block pattern.  Although this was not apparent on today's tracing, he did have a split second heart sound when I was auscultating his chest, suggesting it may be intermittent and rate related.  The QT interval is mildly prolonged on today's tracing at 480 ms.    Past Medical History:  Diagnosis Date   Hypertension     Past Surgical History:  Procedure Laterality Date   JOINT REPLACEMENT     hip    Current  Medications: Current Meds  Medication Sig   meclizine (ANTIVERT) 25 MG tablet Take by mouth.   rosuvastatin (CRESTOR) 20 MG tablet Take 1 tablet (20 mg total) by mouth daily.   valsartan (DIOVAN) 160 MG tablet Take 1 tablet (160 mg total) by mouth daily.     Allergies:   Patient has no known allergies.   Social History   Socioeconomic History   Marital status: Single    Spouse name: Not on file   Number of children: Not on file   Years of education: Not on file   Highest education level: Not on file  Occupational History   Not on file  Tobacco Use   Smoking status: Never    Passive exposure: Never   Smokeless tobacco: Never  Vaping Use   Vaping status: Never Used  Substance and Sexual Activity   Alcohol use: Never   Drug use: Never   Sexual activity: Not on file  Other Topics Concern   Not on file  Social History Narrative   Not on file   Social Determinants of Health   Financial Resource Strain: Low Risk  (01/09/2023)   Overall Financial Resource Strain (CARDIA)    Difficulty of Paying Living Expenses: Not hard at all  Food Insecurity: No Food Insecurity (01/09/2023)   Hunger Vital Sign    Worried About Running Out of Food in the Last Year: Never true    Ran Out of Food in the Last Year: Never  true  Transportation Needs: No Transportation Needs (01/09/2023)   PRAPARE - Administrator, Civil Service (Medical): No    Lack of Transportation (Non-Medical): No  Physical Activity: Sufficiently Active (01/09/2023)   Exercise Vital Sign    Days of Exercise per Week: 7 days    Minutes of Exercise per Session: 30 min  Stress: No Stress Concern Present (01/09/2023)   [employer] of Occupational Health - Occupational Stress Questionnaire    Feeling of Stress : Not at all  Social Connections: Moderately Integrated (01/09/2023)   Social Connection and Isolation Panel [NHANES]    Frequency of Communication with Friends and Family: More than three times a week     Frequency of Social Gatherings with Friends and Family: Twice a week    Attends Religious Services: More than 4 times per year    Active Member of [employer] or Organizations: Yes    Attends Engineer, structural: More than 4 times per year    Marital Status: Never married     Family History: The patient's family history includes Asthma in his father; Cancer in his father and mother.  ROS:   Please see the history of present illness.     All other systems reviewed and are negative.  EKGs/Labs/Other Studies Reviewed:    The following studies were reviewed today: Notes from 12/27/2021 ED visit and notes from primary care provider  EKG:  EKG is  ordered today.  The ekg ordered today demonstrates NSR, incomplete RBBB, mildly prolonged QT 480 ms  Recent Labs: 09/17/2022: ALT 15; BUN 16; Creatinine, Ser 1.07; Potassium 4.4; Sodium 139  Recent Lipid Panel    Component Value Date/Time   CHOL 154 03/19/2023 0840   TRIG 51 03/19/2023 0840   HDL 62 03/19/2023 0840   CHOLHDL 2.5 03/19/2023 0840   LDLCALC 81 03/19/2023 0840     Risk Assessment/Calculations:           Physical Exam:    VS:  BP 136/79   Pulse 69   Ht 5\' 5"  (1.651 m)   Wt 157 lb (71.2 kg)   SpO2 96%   BMI 26.13 kg/m     Wt Readings from Last 3 Encounters:  03/19/23 157 lb (71.2 kg)  01/09/23 160 lb (72.6 kg)  09/17/22 155 lb (70.3 kg)     GEN:  Well nourished, well developed in no acute distress HEENT: He has had the resection of the tragus of the left ear; he has partial left facial paralysis involving the distribution of the lower branches of the nerve; multiple missing teeth NECK: No JVD; No carotid bruits.  Scar of old tracheotomy LYMPHATICS: No lymphadenopathy CARDIAC: RRR, no murmurs, rubs, gallops RESPIRATORY:  Clear to auscultation without rales, wheezing or rhonchi  ABDOMEN: Soft, non-tender, non-distended MUSCULOSKELETAL:  No edema; No deformity  SKIN: Warm and dry NEUROLOGIC:  Alert and  oriented x 3; neurological deficits involving the left side of his face and the left lower extremity, postpolio PSYCHIATRIC:  Normal affect   ASSESSMENT:    1. QT prolongation   2. Essential hypertension   3. Pure hypercholesterolemia   4. Vertigo   5. Dizziness    PLAN:    In order of problems listed above:  Abnormal ECG: mild QT interval prolongation, seen in the past when it was related to electrolyte abnormalities (hypocalcemia and hypokalemia) and then resolved.  Probably related to nortriptyline and meclizine. Avoid these higher doses of meclizine, which aren't really helping  anyway.  Avoid other QT prolonging medications. HTN: adequate control. Symptoms of dizziness are triggered by turning head sideways, not consistent with orthostatic hypotension. HLP: Labs in March showed LDL cholesterol 162.  Subsequently started on rosuvastatin.  Labs checked after today's visit showed LDL cholesterol of 81, a 50% reduction from baseline and I think an adequate level since he does not have clinically evident CAD or PAD. Dizziness: Has been diagnosed as having benign positional vertigo, but typical interventions have not led to any relief.  Note that his MRI from June 2023 showed possible old lacunar infarct in the left basal ganglia paranasal sinus disease and bilateral mastoid effusions as well as significant cervical spondylosis causing at least moderate spinal canal stenosis.  Will check duplex ultrasound for carotid stenoses.        Medication Adjustments/Labs and Tests Ordered: Current medicines are reviewed at length with the patient today.  Concerns regarding medicines are outlined above.  Orders Placed This Encounter  Procedures   Lipid panel   EKG 12-Lead   VAS US CAROTID   No orders of the defined types were placed in this encounter.   Patient Instructions  Medication Instructions:  No changes *If you need a refill on your cardiac medications before your next appointment,  please call your pharmacy*   Lab Work: Lipid panel If you have labs (blood work) drawn today and your tests are completely normal, you will receive your results only by: MyChart Message (if you have MyChart) OR A paper copy in the mail If you have any lab test that is abnormal or we need to change your treatment, we will call you to review the results.   Testing/Procedures: Your physician has requested that you have a carotid duplex. This test is an ultrasound of the carotid arteries in your neck. It looks at blood flow through these arteries that supply the brain with blood. Allow one hour for this exam. There are no restrictions or special instructions.    Follow-Up: At [facility], you and your health needs are our priority.  As part of our continuing mission to provide you with exceptional heart care, we have created designated Provider Care Teams.  These Care Teams include your primary Cardiologist (physician) and Advanced Practice Providers (APPs -  Physician Assistants and Nurse Practitioners) who all work together to provide you with the care you need, when you need it.  We recommend signing up for the patient portal called "MyChart".  Sign up information is provided on this After Visit Summary.  MyChart is used to connect with patients for Virtual Visits (Telemedicine).  Patients are able to view lab/test results, encounter notes, upcoming appointments, etc.  Non-urgent messages can be sent to your provider as well.   To learn more about what you can do with MyChart, go to [email or web address].    Your next appointment:   1 year(s)  Provider:   Thurmon Fair, MD        Signed, Leonard Fair, MD  03/23/2023 9:35 AM    [facility]

## 2023-03-23 ENCOUNTER — Encounter: Payer: Self-pay | Admitting: Cardiovascular Disease

## 2023-04-14 ENCOUNTER — Ambulatory Visit (HOSPITAL_COMMUNITY)
Admission: RE | Admit: 2023-04-14 | Discharge: 2023-04-14 | Disposition: A | Discharge status: Home / Self Care | Payer: 59 | Source: Ambulatory Visit | Attending: Internal Medicine | Admitting: Internal Medicine

## 2023-04-14 DIAGNOSIS — R42 Dizziness and giddiness: Secondary | ICD-10-CM | POA: Diagnosis not present

## 2023-04-18 ENCOUNTER — Other Ambulatory Visit: Payer: Self-pay | Admitting: Cardiovascular Disease

## 2023-04-18 DIAGNOSIS — E782 Mixed hyperlipidemia: Secondary | ICD-10-CM

## 2023-05-29 ENCOUNTER — Telehealth: Payer: Self-pay | Admitting: Family

## 2023-05-29 NOTE — Telephone Encounter (Signed)
Representative from [facility] called stated patient Quantiflow results are left leg 0.83 mild, right leg 0.65 mild and Asymptomatic

## 2023-05-30 NOTE — Telephone Encounter (Signed)
Scheduled on 11/20 at 1:20pm

## 2023-05-30 NOTE — Telephone Encounter (Signed)
Dr. Andrey Campanile please advise. I am also adding Dr. Alvis Lemmings for advisement due to Dr. Andrey Campanile is out of office today. Thank you.

## 2023-05-30 NOTE — Telephone Encounter (Signed)
Thank you Dr. Alvis Lemmings. Ethelene Browns please schedule appointment. Thank you.

## 2023-05-30 NOTE — Telephone Encounter (Signed)
He needs to have an office visit so you can check his pulses and also perform an in office ABI. If you do not have an ABI machine in the clinic and he has non palpable pulses then you can refer him to vascular.

## 2023-06-04 ENCOUNTER — Encounter: Payer: Self-pay | Admitting: Family

## 2023-06-04 ENCOUNTER — Ambulatory Visit (INDEPENDENT_AMBULATORY_CARE_PROVIDER_SITE_OTHER): Payer: 59 | Admitting: Family

## 2023-06-04 VITALS — BP 127/77 | HR 71 | Temp 98.8°F | Ht 65.5 in | Wt 153.2 lb

## 2023-06-04 DIAGNOSIS — R6889 Other general symptoms and signs: Secondary | ICD-10-CM | POA: Diagnosis not present

## 2023-06-04 NOTE — Progress Notes (Signed)
Patient wants to talk about his home health visit he had.  Patient says he needs Nortriptyline.   Patient declined Flu vaccine.

## 2023-06-04 NOTE — Progress Notes (Signed)
Patient ID: Leonard Green, male    DOB: Jan 20, 1951  MRN: [id number]  CC: Follow-Up  Subjective: Leonard Green is a 72 y.o. male who presents for follow-up.   His concerns today include:  - Recently representative from [facility] called and stated patient Leonard Green results are left leg 0.83 mild, right leg 0.65 mild and asymptomatic. - Vertigo medications managed by specialist.  Patient Active Problem List   Diagnosis Date Noted   BPPV (benign paroxysmal positional vertigo), unspecified laterality 09/03/2022   Other dysphagia 07/18/2022   Hyperlipidemia 03/07/2022   Prediabetes 03/07/2022   Spondylosis without myelopathy or radiculopathy, cervical region 02/19/2022   Fracture of femoral neck, left (HCC) 05/21/2011     Current Outpatient Medications on File Prior to Visit  Medication Sig Dispense Refill   rosuvastatin (CRESTOR) 20 MG tablet TAKE 1 TABLET BY MOUTH EVERY DAY 90 tablet 3   valsartan (DIOVAN) 160 MG tablet Take 1 tablet (160 mg total) by mouth daily. 90 tablet 3   meclizine (ANTIVERT) 25 MG tablet Take by mouth.     nortriptyline (PAMELOR) 25 MG capsule Take 25 mg by mouth at bedtime. (Patient not taking: Reported on 03/19/2023)     No current facility-administered medications on file prior to visit.    No Known Allergies  Social History   Socioeconomic History   Marital status: Single    Spouse name: Not on file   Number of children: Not on file   Years of education: Not on file   Highest education level: Not on file  Occupational History   Not on file  Tobacco Use   Smoking status: Never    Passive exposure: Never   Smokeless tobacco: Never  Vaping Use   Vaping status: Never Used  Substance and Sexual Activity   Alcohol use: Never   Drug use: Never   Sexual activity: Not on file  Other Topics Concern   Not on file  Social History Narrative   Not on file   Social Determinants of Health   Financial Resource Strain: Low Risk  (01/09/2023)   Overall  Financial Resource Strain (CARDIA)    Difficulty of Paying Living Expenses: Not hard at all  Food Insecurity: No Food Insecurity (01/09/2023)   Hunger Vital Sign    Worried About Running Out of Food in the Last Year: Never true    Ran Out of Food in the Last Year: Never true  Transportation Needs: No Transportation Needs (01/09/2023)   PRAPARE - Administrator, Civil Service (Medical): No    Lack of Transportation (Non-Medical): No  Physical Activity: Sufficiently Active (01/09/2023)   Exercise Vital Sign    Days of Exercise per Week: 7 days    Minutes of Exercise per Session: 30 min  Stress: No Stress Concern Present (01/09/2023)   [employer] of Occupational Health - Occupational Stress Questionnaire    Feeling of Stress : Not at all  Social Connections: Moderately Integrated (01/09/2023)   Social Connection and Isolation Panel [NHANES]    Frequency of Communication with Friends and Family: More than three times a week    Frequency of Social Gatherings with Friends and Family: Twice a week    Attends Religious Services: More than 4 times per year    Active Member of [employer] or Organizations: Yes    Attends Banker Meetings: More than 4 times per year    Marital Status: Never married  Intimate Partner Violence: Not At Risk (01/09/2023)  Humiliation, Afraid, Rape, and Kick questionnaire    Fear of Current or Ex-Partner: No    Emotionally Abused: No    Physically Abused: No    Sexually Abused: No    Family History  Problem Relation Age of Onset   Cancer Mother    Asthma Father    Cancer Father     Past Surgical History:  Procedure Laterality Date   JOINT REPLACEMENT     hip    ROS: Review of Systems Negative except as stated above  PHYSICAL EXAM: BP 127/77   Pulse 71   Temp 98.8 F (37.1 C) (Oral)   Ht 5' 5.5" (1.664 m)   Wt 153 lb 3.2 oz (69.5 kg)   SpO2 97%   BMI 25.11 kg/m   Physical Exam HENT:     Head: Normocephalic and  atraumatic.     Nose: Nose normal.     Mouth/Throat:     Mouth: Mucous membranes are moist.     Pharynx: Oropharynx is clear.  Eyes:     Extraocular Movements: Extraocular movements intact.     Conjunctiva/sclera: Conjunctivae normal.     Pupils: Pupils are equal, round, and reactive to light.  Cardiovascular:     Rate and Rhythm: Normal rate and regular rhythm.     Pulses: Normal pulses.     Heart sounds: Normal heart sounds.  Pulmonary:     Effort: Pulmonary effort is normal.     Breath sounds: Normal breath sounds.  Musculoskeletal:        General: Normal range of motion.     Right shoulder: Normal.     Left shoulder: Normal.     Right upper arm: Normal.     Left upper arm: Normal.     Right elbow: Normal.     Left elbow: Normal.     Right forearm: Normal.     Left forearm: Normal.     Right wrist: Normal.     Left wrist: Normal.     Right hand: Normal.     Left hand: Normal.     Cervical back: Normal, normal range of motion and neck supple.     Thoracic back: Normal.     Lumbar back: Normal.     Right hip: Normal.     Left hip: Normal.     Right upper leg: Normal.     Left upper leg: Normal.     Right knee: Normal.     Left knee: Normal.     Right lower leg: Normal.     Left lower leg: Normal.     Right ankle: Normal.     Left ankle: Normal.     Right foot: Normal.     Left foot: Normal.  Neurological:     General: No focal deficit present.     Mental Status: He is alert and oriented to person, place, and time.  Psychiatric:        Mood and Affect: Mood normal.        Behavior: Behavior normal.     ASSESSMENT AND PLAN: Note - I discussed plan of care with Leonard Skeans, MD.   1. Abnormal ankle brachial index (ABI) - Referral to Vascular Surgery for evaluation/management.  - Ambulatory referral to Vascular Surgery   Patient was given the opportunity to ask questions.  Patient verbalized understanding of the plan and was able to repeat key elements of  the plan. Patient was given clear instructions to go to Emergency  Department or return to medical center if symptoms don't improve, worsen, or new problems develop.The patient verbalized understanding.   Orders Placed This Encounter  Procedures   Ambulatory referral to Vascular Surgery   Follow-up with primary provider as scheduled.   Leonard Fendt, NP

## 2023-07-01 ENCOUNTER — Other Ambulatory Visit: Payer: Self-pay | Admitting: *Deleted

## 2023-07-01 DIAGNOSIS — R6889 Other general symptoms and signs: Secondary | ICD-10-CM

## 2023-07-07 NOTE — Progress Notes (Unsigned)
Office Note     CC:  Abnormal ABIs Requesting Provider:  Rema Fendt, NP  HPI: Leonard Green is a 72 y.o. (Sep 12, 1950) male presenting at the request of .Leonard Fendt, NP abnormal ABIs on screening exam.  On exam, Leonard Green was doing well.  A native [place], he moved to [place] decades ago after being diagnosed with polio as a child.  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  He now lives off of Social Security.  Leonard Green denies symptoms of claudication, ischemic rest pain, tissue loss.  He still walks 1 mile daily.   The pt is  on a statin for cholesterol management.    Past Medical History:  Diagnosis Date   Hypertension     Past Surgical History:  Procedure Laterality Date   JOINT REPLACEMENT     hip    Social History   Socioeconomic History   Marital status: Single    Spouse name: Not on file   Number of children: Not on file   Years of education: Not on file   Highest education level: Not on file  Occupational History   Not on file  Tobacco Use   Smoking status: Never    Passive exposure: Never   Smokeless tobacco: Never  Vaping Use   Vaping status: Never Used  Substance and Sexual Activity   Alcohol use: Never   Drug use: Never   Sexual activity: Not on file  Other Topics Concern   Not on file  Social History Narrative   Not on file   Social Drivers of Health   Financial Resource Strain: Low Risk  (01/09/2023)   Overall Financial Resource Strain (CARDIA)    Difficulty of Paying Living Expenses: Not hard at all  Food Insecurity: No Food Insecurity (01/09/2023)   Hunger Vital Sign    Worried About Running Out of Food in the Last Year: Never true    Ran Out of Food in the Last Year: Never true  Transportation Needs: No Transportation Needs (01/09/2023)   PRAPARE - Administrator, Civil Service (Medical): No    Lack of Transportation (Non-Medical): No  Physical Activity: Sufficiently Active (01/09/2023)    Exercise Vital Sign    Days of Exercise per Week: 7 days    Minutes of Exercise per Session: 30 min  Stress: No Stress Concern Present (01/09/2023)   Leonard Green of Occupational Health - Occupational Stress Questionnaire    Feeling of Stress : Not at all  Social Connections: Moderately Integrated (01/09/2023)   Social Connection and Isolation Panel [NHANES]    Frequency of Communication with Friends and Family: More than three times a week    Frequency of Social Gatherings with Friends and Family: Twice a week    Attends Religious Services: More than 4 times per year    Active Member of [employer] or Organizations: Yes    Attends Banker Meetings: More than 4 times per year    Marital Status: Never married  Intimate Partner Violence: Not At Risk (01/09/2023)   Humiliation, Afraid, Rape, and Kick questionnaire    Fear of Current or Ex-Partner: No    Emotionally Abused: No    Physically Abused: No    Sexually Abused: No   Family History  Problem Relation Age of Onset   Cancer Mother    Asthma Father    Cancer Father     Current Outpatient Medications  Medication Sig Dispense Refill  meclizine (ANTIVERT) 25 MG tablet Take by mouth.     nortriptyline (PAMELOR) 25 MG capsule Take 25 mg by mouth at bedtime. (Patient not taking: Reported on 03/19/2023)     rosuvastatin (CRESTOR) 20 MG tablet TAKE 1 TABLET BY MOUTH EVERY DAY 90 tablet 3   valsartan (DIOVAN) 160 MG tablet Take 1 tablet (160 mg total) by mouth daily. 90 tablet 3   No current facility-administered medications for this visit.    No Known Allergies   REVIEW OF SYSTEMS:  [X]  denotes positive finding, [ ]  denotes negative finding Cardiac  Comments:  Chest pain or chest pressure:    Shortness of breath upon exertion:    Short of breath when lying flat:    Irregular heart rhythm:        Vascular    Pain in calf, thigh, or hip brought on by ambulation:    Pain in feet at night that wakes you up from your  sleep:     Blood clot in your veins:    Leg swelling:         Pulmonary    Oxygen at home:    Productive cough:     Wheezing:         Neurologic    Sudden weakness in arms or legs:     Sudden numbness in arms or legs:     Sudden onset of difficulty speaking or slurred speech:    Temporary loss of vision in one eye:     Problems with dizziness:         Gastrointestinal    Blood in stool:     Vomited blood:         Genitourinary    Burning when urinating:     Blood in urine:        Psychiatric    Major depression:         Hematologic    Bleeding problems:    Problems with blood clotting too easily:        Skin    Rashes or ulcers:        Constitutional    Fever or chills:      PHYSICAL EXAMINATION:  There were no vitals filed for this visit.  General:  WDWN in NAD; vital signs documented above Gait: Not observed HENT: WNL, normocephalic, poor dentition, left-sided facial droop Pulmonary: normal non-labored breathing , without wheezing Cardiac: regular HR Abdomen: soft, NT, no masses Skin: without rashes Vascular Exam/Pulses:  Right Left  Radial 2+ (normal) 2+ (normal)  Ulnar    Femoral    Popliteal    DP 2+ (normal) 2+ (normal)  PT     Extremities: without ischemic changes Musculoskeletal: no muscle wasting or atrophy  Neurologic: A&O X 3;  No focal weakness or paresthesias are detected Psychiatric:  The pt has Normal affect.   Non-Invasive Vascular Imaging:   Summary:  Right: Resting right ankle-brachial index is within normal range. The  right toe-brachial index is normal.   Left: Resting left ankle-brachial index is within normal range. The left  toe-brachial index is normal.      ASSESSMENT/PLAN: Leonard Green is a 72 y.o. male presenting with abnormal ABI findings. On physical exam, he had palpable pulses in the feet.  Repeat ABI demonstrated normal perfusion to the feet bilaterally.  He had excellent toe pressures.  We discussed  the importance of staying active.  I am happy that he is on statin therapy. He can follow-up  with me as needed.   Leonard Sparrow, MD [facility] [phone]

## 2023-07-10 ENCOUNTER — Ambulatory Visit (INDEPENDENT_AMBULATORY_CARE_PROVIDER_SITE_OTHER): Payer: 59 | Admitting: Vascular Surgery

## 2023-07-10 ENCOUNTER — Ambulatory Visit (HOSPITAL_COMMUNITY)
Admission: RE | Admit: 2023-07-10 | Discharge: 2023-07-10 | Disposition: A | Discharge status: Home / Self Care | Payer: 59 | Source: Ambulatory Visit | Attending: Vascular Surgery | Admitting: Vascular Surgery

## 2023-07-10 VITALS — BP 153/88 | HR 68 | Temp 98.7°F | Ht 65.0 in | Wt 154.2 lb

## 2023-07-10 DIAGNOSIS — R936 Abnormal findings on diagnostic imaging of limbs: Secondary | ICD-10-CM | POA: Diagnosis not present

## 2023-07-10 DIAGNOSIS — R6889 Other general symptoms and signs: Secondary | ICD-10-CM

## 2023-07-10 LAB — VAS US ABI WITH/WO TBI
Left ABI: 1.2
Right ABI: 1.09

## 2023-10-05 ENCOUNTER — Other Ambulatory Visit: Payer: Self-pay | Admitting: Nurse Practitioner

## 2023-10-05 DIAGNOSIS — I10 Essential (primary) hypertension: Secondary | ICD-10-CM

## 2024-01-12 ENCOUNTER — Ambulatory Visit: Payer: Self-pay

## 2024-01-12 NOTE — Telephone Encounter (Signed)
 FYI Only or Action Required?: FYI only for provider.  Patient was last seen in primary care on 06/04/2023 by Lorren Greig PARAS, NP. Called Nurse Triage reporting Rib Injury. Symptoms began several days ago. Interventions attempted: OTC medications: Tylenol . Symptoms are: stable.  Triage Disposition: See PCP When Office is Open (Within 3 Days)  Patient/caregiver understands and will follow disposition?: Yes  Clemens helping his brother clean up limbs from tree. R side of ribs hurting when coughing or movement. No appts soon with PCP so advised pt he can go to UC. Offered to schedule UC appt but pt will do walk in appt tomorrow. Care advice given and pt verbalized understanding.   Copied from CRM [id number] [id number]. Topic: Clinical - Red Word Triage >> Jan 12, 2024  4:54 PM Delon HERO wrote: Red Word that prompted transfer to Nurse Triage; Patient is calling to report that he fell in the yard on Thursday on [place]. Fell on his ribs, reporting some pain when he coughs. Been coughing for over a month. Causing him to be out of breathe. Please advise Reason for Disposition  [1] After 72 hours AND [2] chest pain not improving  Answer Assessment - Initial Assessment Questions 1. MECHANISM: How did the injury happen?     Clemens helping brother cleaning up some limbs  2. ONSET: When did the injury happen? (Minutes or hours ago)     Thursday  3. LOCATION: Where on the chest is the injury located?     On R side of chest  5. BLEEDING: Is there any bleeding now? If Yes, ask: How long has it been bleeding?     no 6. SEVERITY: Any difficulty with breathing?     Breathing ok just hurts when coughing  7. SIZE: For cuts, bruises, or swelling, ask: How large is it? (e.g., inches or centimeters)     No swelling or bruising  8. PAIN: Is there pain? If Yes, ask: How bad is the pain?   (e.g., Scale 1-10; or mild, moderate, severe)     5/10  Protocols used: Chest Injury-A-AH

## 2024-01-13 ENCOUNTER — Ambulatory Visit
Admission: EM | Admit: 2024-01-13 | Discharge: 2024-01-13 | Disposition: A | Discharge status: Home / Self Care | Attending: Family Medicine | Admitting: Family Medicine

## 2024-01-13 ENCOUNTER — Ambulatory Visit (INDEPENDENT_AMBULATORY_CARE_PROVIDER_SITE_OTHER)

## 2024-01-13 DIAGNOSIS — R0781 Pleurodynia: Secondary | ICD-10-CM

## 2024-01-13 NOTE — ED Provider Notes (Signed)
 [facility]    CSN: [id number] Arrival date & time: 01/13/24  0809      History   Chief Complaint Chief Complaint  Patient presents with   Fall    HPI Leonard Green is a 73 y.o. male.   Rib pain since Thursday 6/26 after a fall onto right side Was working outside, was bending over to pick up some sticks and felt dizzy briefly, then fell onto right arm, elbow hit his ribs Did not hit head or lose consciousness. No preceding or residual CP/SOB, palpitations, headache, vision changes Was able to get back up right after and felt normal  Has not been having many falls - every few months - usually tripping over things while working outside  Since last Thursday has had nonradiating R 6th-7th rib pain - lateral, along mid axillary line Taking tylenol  with improvement Overall feels improved since last week. Occasionally feels some sharp pain with deep breathing or sneezing.  Feels that his pain is well-controlled with OTC Tylenol  No hx rib fractures   Fall    Past Medical History:  Diagnosis Date   Hypertension     Patient Active Problem List   Diagnosis Date Noted   Disequilibrium 12/03/2022   BPPV (benign paroxysmal positional vertigo), unspecified laterality 09/03/2022   Neck pain 09/03/2022   Vestibular migraine 09/03/2022   Other dysphagia 07/18/2022   Hyperlipidemia 03/07/2022   Prediabetes 03/07/2022   Spondylosis without myelopathy or radiculopathy, cervical region 02/19/2022   Fracture of femoral neck, left (HCC) 05/21/2011    Past Surgical History:  Procedure Laterality Date   JOINT REPLACEMENT     hip       Home Medications    Prior to Admission medications   Medication Sig Start Date End Date Taking? Authorizing Provider  nortriptyline (PAMELOR) 25 MG capsule Take 75 mg by mouth at bedtime. 01/14/23 01/14/24 Yes [provider]  meclizine  (ANTIVERT ) 25 MG tablet Take by mouth. 08/12/22   [provider]   nortriptyline (PAMELOR) 25 MG capsule Take 25 mg by mouth at bedtime. 09/03/22   [provider]  rosuvastatin  (CRESTOR ) 20 MG tablet TAKE 1 TABLET BY MOUTH EVERY DAY 04/18/23   Croitoru, Mihai, MD  valsartan  (DIOVAN ) 160 MG tablet TAKE 1 TABLET BY MOUTH EVERY DAY 10/06/23   Croitoru, Jerel, MD    Family History Family History  Problem Relation Age of Onset   Cancer Mother    Asthma Father    Cancer Father     Social History Social History   Tobacco Use   Smoking status: Never    Passive exposure: Never   Smokeless tobacco: Never  Vaping Use   Vaping status: Never Used  Substance Use Topics   Alcohol use: Never   Drug use: Never     Allergies   Patient has no known allergies.   Review of Systems Review of Systems Per HPI   Physical Exam Triage Vital Signs ED Triage Vitals  Encounter Vitals Group     BP --      Girls Systolic BP Percentile --      Girls Diastolic BP Percentile --      Boys Systolic BP Percentile --      Boys Diastolic BP Percentile --      Pulse --      Resp --      Temp --      Temp src --      SpO2 --  Weight 01/13/24 0820 154 lb 1.6 oz (69.9 kg)     Height 01/13/24 0820 5' 5 (1.651 m)     Head Circumference --      Peak Flow --      Pain Score 01/13/24 0817 4     Pain Loc --      Pain Education --      Exclude from Growth Chart --    No data found.  Updated Vital Signs BP (!) 169/84 (BP Location: Left Arm) Comment: To recheck prior to d/c as needed  Pulse 60   Temp 98 F (36.7 C) (Oral)   Resp 18   Ht 5' 5 (1.651 m)   Wt 69.9 kg   SpO2 98%   BMI 25.64 kg/m   Visual Acuity Right Eye Distance:   Left Eye Distance:   Bilateral Distance:    Right Eye Near:   Left Eye Near:    Bilateral Near:     Physical Exam Constitutional:      General: He is not in acute distress.    Appearance: Normal appearance.  HENT:     Head: Normocephalic and atraumatic.     Right Ear: External ear normal.     Left Ear:  External ear normal.     Nose: Nose normal.   Cardiovascular:     Rate and Rhythm: Normal rate and regular rhythm.     Heart sounds: No murmur heard. Pulmonary:     Effort: Pulmonary effort is normal. No respiratory distress.     Breath sounds: Normal breath sounds. No wheezing.     Comments: Mildly TTP around R lateral 6th-7th rib at mid axillary line Chest:     Chest wall: Tenderness present.  Abdominal:     General: There is no distension.   Skin:    General: Skin is warm and dry.   Neurological:     General: No focal deficit present.     Mental Status: He is alert. Mental status is at baseline.   Psychiatric:        Mood and Affect: Mood normal.        Thought Content: Thought content normal.      UC Treatments / Results  Labs (all labs ordered are listed, but only abnormal results are displayed) Labs Reviewed - No data to display  EKG   Radiology DG Ribs Unilateral W/Chest Right Result Date: 01/13/2024 CLINICAL DATA:  Right rib pain after recent fall. EXAM: RIGHT RIBS AND CHEST - 3+ VIEW COMPARISON:  None Available. FINDINGS: Contour irregularity of the right anterolateral fourth, fifth, and possibly sixth ribs is concerning for nondisplaced fracture. Multilevel degenerative changes of the thoracolumbar spine. There is no evidence of pneumothorax or pleural effusion. Linear scarring in the right mid to upper lung zone. Biapical pleuroparenchymal scarring. No focal consolidation. Heart size and mediastinal contours are within normal limits. IMPRESSION: 1. Contour irregularity of the right anterolateral fourth, fifth, and possibly sixth ribs is concerning for nondisplaced fracture. Recommend correlation with point tenderness. CT chest could be considered for further evaluation. 2. No acute cardiopulmonary findings. Electronically Signed   By: Harrietta Sherry M.D.   On: 01/13/2024 09:26    Procedures Procedures (including critical care time)  Medications Ordered in  UC Medications - No data to display  Initial Impression / Assessment and Plan / UC Course  I have reviewed the triage vital signs and the nursing notes.  Pertinent labs & imaging results that were available during my  care of the patient were reviewed by me and considered in my medical decision making (see chart for details).     Right rib pain ongoing since last Thursday after fall onto right arm Pain is overall improving with OTC Tylenol  and patient appears well Does have some mild tenderness to palpation along lateral right ribs Low suspicion for fracture but will obtain x-ray to rule out  9:38 AM  X-ray showed contour irregularity of right anterolateral fourth, fifth, and possibly 6th ribs concerning for nondisplaced fracture.  Can consider CT for additional evaluation.  Given stability of patient with minimal pain or tenderness, feel he is stable for outpatient monitoring. Recommend f/u with PCP especially if worsening. Do not feel urgent CT is needed at this time.  To discharge with continued OTC pain control with Tylenol  as needed, discussed return precautions   Final Clinical Impressions(s) / UC Diagnoses   Final diagnoses:  Rib pain on right side     Discharge Instructions      Your x-ray showed a possible fracture, but since your pain is minimal and you have been improving, this may not be the case. Please follow up with your primary care physician especially if your symptoms worsen, as additional imaging can be obtained for further evaluation.  You can continue to use over-the-counter Tylenol  for your pain.  Please seek medical attention if your pain is worsening or you are having any trouble breathing or pain in your chest.  Please also seek attention if you continue to have falls and/or dizziness     ED Prescriptions   None    PDMP not reviewed this encounter.   Romelle Booty, MD 01/13/24 [phone]

## 2024-01-13 NOTE — Discharge Instructions (Addendum)
 Your x-ray showed a possible fracture, but since your pain is minimal and you have been improving, this may not be the case. Please follow up with your primary care physician especially if your symptoms worsen, as additional imaging can be obtained for further evaluation.  You can continue to use over-the-counter Tylenol  for your pain.  Please seek medical attention if your pain is worsening or you are having any trouble breathing or pain in your chest.  Please also seek attention if you continue to have falls and/or dizziness

## 2024-01-13 NOTE — ED Triage Notes (Signed)
 On Thursday (last week) I fell and landed on my right side/arm, my arm isn't hurt but my ribs are hurting since (right side). No loc. No lacerations.

## 2024-01-13 NOTE — Telephone Encounter (Signed)
 Patient is been seen at [facility]

## 2024-04-16 ENCOUNTER — Other Ambulatory Visit: Payer: Self-pay | Admitting: Cardiovascular Disease

## 2024-04-16 DIAGNOSIS — I10 Essential (primary) hypertension: Secondary | ICD-10-CM

## 2024-05-04 ENCOUNTER — Ambulatory Visit (INDEPENDENT_AMBULATORY_CARE_PROVIDER_SITE_OTHER)

## 2024-05-04 VITALS — Ht 65.0 in | Wt 154.0 lb

## 2024-05-04 DIAGNOSIS — Z0000 Encounter for general adult medical examination without abnormal findings: Secondary | ICD-10-CM

## 2024-05-04 NOTE — Progress Notes (Signed)
 Subjective:   Leonard Green is a 73 y.o. who presents for a Medicare Wellness preventive visit.  As a reminder, Annual Wellness Visits don't include a physical exam, and some assessments may be limited, especially if this visit is performed virtually. We may recommend an in-person follow-up visit with your provider if needed.  Visit Complete: Virtual I connected with  Leonard Green on 05/04/24 by a audio enabled telemedicine application and verified that I am speaking with the correct person using two identifiers.  Patient Location: Home  Provider Location: Home Office  I discussed the limitations of evaluation and management by telemedicine. The patient expressed understanding and agreed to proceed.  Vital Signs: Because this visit was a virtual/telehealth visit, some criteria may be missing or patient reported. Any vitals not documented were not able to be obtained and vitals that have been documented are patient reported.  VideoDeclined- This patient declined Librarian, academic. Therefore the visit was completed with audio only.  Persons Participating in Visit: Patient.  AWV Questionnaire: No: Patient Medicare AWV questionnaire was not completed prior to this visit.  Cardiac Risk Factors include: advanced age (>39men, >19 women);male gender;dyslipidemia     Objective:    Today's Vitals   05/04/24 0933  Weight: 154 lb (69.9 kg)  Height: 5' 5 (1.651 m)   Body mass index is 25.63 kg/m.     05/04/2024    9:40 AM 01/28/2023    8:05 AM 01/09/2023   10:22 AM 03/06/2022    9:58 AM 12/27/2021    1:33 PM  Advanced Directives  Does Patient Have a Medical Advance Directive? No Yes No No No  Would patient like information on creating a medical advance directive?    Yes (Inpatient - patient defers creating a medical advance directive at this time - Information given) No - Patient declined    Current Medications (verified) Outpatient Encounter  Medications as of 05/04/2024  Medication Sig   meclizine  (ANTIVERT ) 25 MG tablet Take by mouth.   nortriptyline (PAMELOR) 25 MG capsule Take 25 mg by mouth at bedtime.   rosuvastatin  (CRESTOR ) 20 MG tablet TAKE 1 TABLET BY MOUTH EVERY DAY   valsartan  (DIOVAN ) 160 MG tablet TAKE 1 TABLET BY MOUTH EVERY DAY   nortriptyline (PAMELOR) 25 MG capsule Take 75 mg by mouth at bedtime.   No facility-administered encounter medications on file as of 05/04/2024.    Allergies (verified) Patient has no known allergies.   History: Past Medical History:  Diagnosis Date   Hypertension    Past Surgical History:  Procedure Laterality Date   JOINT REPLACEMENT     hip   Family History  Problem Relation Age of Onset   Cancer Mother    Asthma Father    Cancer Father    Social History   Socioeconomic History   Marital status: Single    Spouse name: Not on file   Number of children: Not on file   Years of education: Not on file   Highest education level: Not on file  Occupational History   Occupation: DISABLITY/RETIRED  Tobacco Use   Smoking status: Never    Passive exposure: Never   Smokeless tobacco: Never  Vaping Use   Vaping status: Never Used  Substance and Sexual Activity   Alcohol use: Never   Drug use: Never   Sexual activity: Not Currently  Other Topics Concern   Not on file  Social History Narrative   Lives with a roommate/2025  Social Drivers of Corporate investment banker Strain: Low Risk  (05/04/2024)   Overall Financial Resource Strain (CARDIA)    Difficulty of Paying Living Expenses: Not very hard  Food Insecurity: No Food Insecurity (05/04/2024)   Hunger Vital Sign    Worried About Running Out of Food in the Last Year: Never true    Ran Out of Food in the Last Year: Never true  Transportation Needs: No Transportation Needs (05/04/2024)   PRAPARE - Administrator, Civil Service (Medical): No    Lack of Transportation (Non-Medical): No  Physical  Activity: Sufficiently Active (05/04/2024)   Exercise Vital Sign    Days of Exercise per Week: 7 days    Minutes of Exercise per Session: 30 min  Stress: No Stress Concern Present (05/04/2024)   [employer] of Occupational Health - Occupational Stress Questionnaire    Feeling of Stress: Not at all  Social Connections: Moderately Integrated (05/04/2024)   Social Connection and Isolation Panel    Frequency of Communication with Friends and Family: More than three times a week    Frequency of Social Gatherings with Friends and Family: More than three times a week    Attends Religious Services: More than 4 times per year    Active Member of [employer] or Organizations: Yes    Attends Engineer, structural: More than 4 times per year    Marital Status: Never married    Tobacco Counseling Counseling given: Not Answered    Clinical Intake:  Pre-visit preparation completed: Yes  Pain : No/denies pain     BMI - recorded: 25.63 Nutritional Status: BMI 25 -29 Overweight Nutritional Risks: None Diabetes: No  Lab Results  Component Value Date   HGBA1C 6.0 (H) 03/06/2022     How often do you need to have someone help you when you read instructions, pamphlets, or other written materials from your doctor or pharmacy?: 1 - Never     Information entered by :: Sabastian Raimondi, RMA   Activities of Daily Living     05/04/2024    9:37 AM  In your present state of health, do you have any difficulty performing the following activities:  Hearing? 1  Comment Has some hearing problems-borderline-per pt  Vision? 0  Difficulty concentrating or making decisions? 0  Walking or climbing stairs? 0  Dressing or bathing? 0  Doing errands, shopping? 0  Preparing Food and eating ? N  Using the Toilet? N  In the past six months, have you accidently leaked urine? N  Do you have problems with loss of bowel control? N  Managing your Medications? N  Managing your Finances? N   Housekeeping or managing your Housekeeping? N    Patient Care Team: Lorren Greig PARAS, NP as PCP - General (Nurse Practitioner) Francyne Headland, MD as PCP - Cardiology (Cardiology)  I have updated your Care Teams any recent Medical Services you may have received from other providers in the past year.     Assessment:   This is a routine wellness examination for [place].  Hearing/Vision screen Hearing Screening - Comments:: Has some hearing problems-borderline-per pt Vision Screening - Comments:: Has lenses-cataract surgery/No provider   Goals Addressed   None    Depression Screen     05/04/2024    9:46 AM 06/04/2023    1:26 PM 01/09/2023   10:24 AM 07/19/2022    3:12 PM 03/06/2022    9:58 AM 01/21/2022    9:48 AM  PHQ  2/9 Scores  PHQ - 2 Score 0 0 3 3 0 0  PHQ- 9 Score 0  6 12      Fall Risk     05/04/2024    9:41 AM 06/04/2023    1:26 PM 01/09/2023   10:22 AM 08/12/2022    9:59 AM 07/19/2022    3:12 PM  Fall Risk   Falls in the past year? 0 0 1 0 0  Comment   moved to quick    Number falls in past yr: 0 0 0 0 0  Injury with Fall? 0 0 0 0 0  Risk for fall due to :  No Fall Risks Medication side effect  No Fall Risks  Follow up Falls evaluation completed;Falls prevention discussed  Falls prevention discussed;Falls evaluation completed      MEDICARE RISK AT HOME:  Medicare Risk at Home Any stairs in or around the home?: Yes (3 steps in front of home) If so, are there any without handrails?: Yes Home free of loose throw rugs in walkways, pet beds, electrical cords, etc?: Yes Adequate lighting in your home to reduce risk of falls?: Yes Life alert?: No Use of a cane, walker or w/c?: No Grab bars in the bathroom?: No Shower chair or bench in shower?: No Elevated toilet seat or a handicapped toilet?: No  TIMED UP AND GO:  Was the test performed?  No  Cognitive Function: 6CIT completed    03/06/2022   10:19 AM  MMSE - Mini Mental State Exam  Orientation to time 5   Orientation to Place 5  Registration 3  Attention/ Calculation 5  Recall 3  Language- name 2 objects 2  Language- repeat 1  Language- follow 3 step command 3  Language- read & follow direction 1  Write a sentence 1  Copy design 1  Total score 30        05/04/2024    9:42 AM 01/09/2023   10:25 AM 03/06/2022   10:17 AM  6CIT Screen  What Year? 0 points 0 points 0 points  What month? 0 points 0 points 0 points  What time? 0 points 0 points 0 points  Count back from 20 0 points 0 points 0 points  Months in reverse 0 points 0 points 0 points  Repeat phrase 0 points 6 points 0 points  Total Score 0 points 6 points 0 points    Immunizations Immunization History  Administered Date(s) Administered   Pneumococcal Conjugate-13 04/26/2016   Pneumococcal Polysaccharide-23 04/21/2017, 03/06/2022   Tdap 02/21/2018    Screening Tests Health Maintenance  Topic Date Due   Colonoscopy  Never done   Zoster Vaccines- Shingrix (1 of 2) Never done   Medicare Annual Wellness (AWV)  01/09/2024   Influenza Vaccine  Never done   COVID-19 Vaccine (1 - 2025-26 season) Never done   DTaP/Tdap/Td (2 - Td or Tdap) 02/22/2028   Pneumococcal Vaccine: 50+ Years  Completed   Hepatitis C Screening  Completed   Meningococcal B Vaccine  Aged Out    Health Maintenance Items Addressed: Vaccines Due: shingles, See Nurse Notes at the end of this note  Additional Screening:  Vision Screening: Recommended annual ophthalmology exams for early detection of glaucoma and other disorders of the eye. Is the patient up to date with their annual eye exam?  No  Who is the provider or what is the name of the office in which the patient attends annual eye exams? Has no provider/patient not up  to date.  Dental Screening: Recommended annual dental exams for proper oral hygiene  Community Resource Referral / Chronic Care Management: CRR required this visit?  No   CCM required this visit?  No   Plan:    I  have personally reviewed and noted the following in the patient's chart:   Medical and social history Use of alcohol, tobacco or illicit drugs  Current medications and supplements including opioid prescriptions. Patient is not currently taking opioid prescriptions. Functional ability and status Nutritional status Physical activity Advanced directives List of other physicians Hospitalizations, surgeries, and ER visits in previous 12 months Vitals Screenings to include cognitive, depression, and falls Referrals and appointments  In addition, I have reviewed and discussed with patient certain preventive protocols, quality metrics, and best practice recommendations. A written personalized care plan for preventive services as well as general preventive health recommendations were provided to patient.   Samuella Rasool L Trang Bouse, CMA   05/04/2024   After Visit Summary: (Mail) Due to this being a telephonic visit, the after visit summary with patients personalized plan was offered to patient via mail   Notes: Patient is due for a shingles vaccine.  He stated that he had received a flu vaccine for this season already.  Patient also stated that he has had a colonoscopy recently.  He has been scheduled to be seen by provider to discuss further.

## 2024-05-04 NOTE — Patient Instructions (Signed)
 Leonard Green,  Thank you for taking the time for your Medicare Wellness Visit. I appreciate your continued commitment to your health goals. Please review the care plan we discussed, and feel free to reach out if I can assist you further.  Medicare recommends these wellness visits once per year to help you and your care team stay ahead of potential health issues. These visits are designed to focus on prevention, allowing your provider to concentrate on managing your acute and chronic conditions during your regular appointments.  Please note that Annual Wellness Visits do not include a physical exam. Some assessments may be limited, especially if the visit was conducted virtually. If needed, we may recommend a separate in-person follow-up with your provider.  Ongoing Care Seeing your primary care provider every 3 to 6 months helps us  monitor your health and provide consistent, personalized care. Last office visit on 06/04/23.  Next office visit on 06/08/24.  You are due for a due for a Shingles vaccine and can get that done at your local pharmacy.  Keep up the good work.  Referrals If a referral was made during today's visit and you haven't received any updates within two weeks, please contact the referred provider directly to check on the status.  Recommended Screenings:  Health Maintenance  Topic Date Due   Colon Cancer Screening  Never done   Zoster (Shingles) Vaccine (1 of 2) Never done   Medicare Annual Wellness Visit  01/09/2024   Flu Shot  Never done   COVID-19 Vaccine (1 - 2025-26 season) Never done   DTaP/Tdap/Td vaccine (2 - Td or Tdap) 02/22/2028   Pneumococcal Vaccine for age over 3  Completed   Hepatitis C Screening  Completed   Meningitis B Vaccine  Aged Out       05/04/2024    9:40 AM  Advanced Directives  Does Patient Have a Medical Advance Directive? No   Advance Care Planning is important because it: Ensures you receive medical care that aligns with your values,  goals, and preferences. Provides guidance to your family and loved ones, reducing the emotional burden of decision-making during critical moments.  Vision: Annual vision screenings are recommended for early detection of glaucoma, cataracts, and diabetic retinopathy. These exams can also reveal signs of chronic conditions such as diabetes and high blood pressure.  Dental: Annual dental screenings help detect early signs of oral cancer, gum disease, and other conditions linked to overall health, including heart disease and diabetes.  Please see the attached documents for additional preventive care recommendations.

## 2024-05-15 ENCOUNTER — Other Ambulatory Visit: Payer: Self-pay | Admitting: Cardiovascular Disease

## 2024-05-15 DIAGNOSIS — I10 Essential (primary) hypertension: Secondary | ICD-10-CM

## 2024-05-27 ENCOUNTER — Other Ambulatory Visit: Payer: Self-pay | Admitting: Cardiovascular Disease

## 2024-05-27 DIAGNOSIS — I10 Essential (primary) hypertension: Secondary | ICD-10-CM

## 2024-05-27 NOTE — Telephone Encounter (Signed)
**  PATIENT NEED MAKE AN APPOINTMENT FOR FURTHER REFILLS**  [phone]

## 2024-06-07 ENCOUNTER — Other Ambulatory Visit: Payer: Self-pay | Admitting: Cardiovascular Disease

## 2024-06-07 DIAGNOSIS — I10 Essential (primary) hypertension: Secondary | ICD-10-CM

## 2024-06-08 ENCOUNTER — Ambulatory Visit: Admitting: Physician Assistant

## 2024-06-08 ENCOUNTER — Encounter: Payer: Self-pay | Admitting: Physician Assistant

## 2024-06-08 ENCOUNTER — Encounter: Admitting: Family

## 2024-06-08 VITALS — BP 145/76 | HR 76 | Ht 65.0 in | Wt 159.0 lb

## 2024-06-08 DIAGNOSIS — I10 Essential (primary) hypertension: Secondary | ICD-10-CM | POA: Insufficient documentation

## 2024-06-08 DIAGNOSIS — Z125 Encounter for screening for malignant neoplasm of prostate: Secondary | ICD-10-CM

## 2024-06-08 DIAGNOSIS — E7849 Other hyperlipidemia: Secondary | ICD-10-CM

## 2024-06-08 DIAGNOSIS — R7303 Prediabetes: Secondary | ICD-10-CM

## 2024-06-08 MED ORDER — VALSARTAN 160 MG PO TABS: 160.0000 mg | ORAL_TABLET | Freq: Every day | ORAL | 0 refills | Status: DC

## 2024-06-08 NOTE — Progress Notes (Unsigned)
 Established Patient Office Visit  Subjective   Patient ID: Leonard Green, male    DOB: 01-Apr-1951  Age: 73 y.o. MRN: [id number]  Chief Complaint  Patient presents with   Medication Refill   Discussed the use of AI scribe software for clinical note transcription with the patient, who gave verbal consent to proceed.  History of Present Illness    Leonard Green is a 73 year old male with hypertension who presents for a blood pressure medication refill.  He took his last dose of valsartan  yesterday and has not taken it today, so he needs a refill. His other medications, including his cholesterol medication and nortriptyline, are current and do not need refills. He has never smoked and does not drink alcohol.  No other concerns at this time.   Past Medical History:  Diagnosis Date   Hypertension    Social History   Socioeconomic History   Marital status: Single    Spouse name: Not on file   Number of children: Not on file   Years of education: Not on file   Highest education level: Not on file  Occupational History   Occupation: DISABLITY/RETIRED  Tobacco Use   Smoking status: Never    Passive exposure: Never   Smokeless tobacco: Never  Vaping Use   Vaping status: Never Used  Substance and Sexual Activity   Alcohol use: Never   Drug use: Never   Sexual activity: Not Currently  Other Topics Concern   Not on file  Social History Narrative   Lives with a roommate/2025   Social Drivers of Health   Financial Resource Strain: Low Risk  (05/04/2024)   Overall Financial Resource Strain (CARDIA)    Difficulty of Paying Living Expenses: Not very hard  Food Insecurity: No Food Insecurity (06/08/2024)   Hunger Vital Sign    Worried About Running Out of Food in the Last Year: Never true    Ran Out of Food in the Last Year: Never true  Transportation Needs: No Transportation Needs (06/08/2024)   PRAPARE - Administrator, Civil Service  (Medical): No    Lack of Transportation (Non-Medical): No  Physical Activity: Sufficiently Active (05/04/2024)   Exercise Vital Sign    Days of Exercise per Week: 7 days    Minutes of Exercise per Session: 30 min  Stress: No Stress Concern Present (05/04/2024)   [employer] of Occupational Health - Occupational Stress Questionnaire    Feeling of Stress: Not at all  Social Connections: Moderately Integrated (05/04/2024)   Social Connection and Isolation Panel    Frequency of Communication with Friends and Family: More than three times a week    Frequency of Social Gatherings with Friends and Family: More than three times a week    Attends Religious Services: More than 4 times per year    Active Member of [employer] or Organizations: Yes    Attends Banker Meetings: More than 4 times per year    Marital Status: Never married  Intimate Partner Violence: Not At Risk (06/08/2024)   Humiliation, Afraid, Rape, and Kick questionnaire    Fear of Current or Ex-Partner: No    Emotionally Abused: No    Physically Abused: No    Sexually Abused: No   Family History  Problem Relation Age of Onset   Cancer Mother    Asthma Father    Cancer Father    No Known Allergies  Review of Systems  Constitutional: Negative.  HENT: Negative.    Eyes: Negative.   Respiratory:  Negative for shortness of breath.   Cardiovascular:  Negative for chest pain.  Gastrointestinal: Negative.   Genitourinary: Negative.   Musculoskeletal: Negative.   Skin: Negative.   Neurological: Negative.   Endo/Heme/Allergies: Negative.   Psychiatric/Behavioral: Negative.        Objective:     BP (!) 145/76 (BP Location: Left Arm, Patient Position: Sitting, Cuff Size: Normal)   Pulse 76   Ht 5' 5 (1.651 m)   Wt 159 lb (72.1 kg)   SpO2 98%   BMI 26.46 kg/m  BP Readings from Last 3 Encounters:  06/08/24 (!) 145/76  01/13/24 (!) 169/84  07/10/23 (!) 153/88   Wt Readings from Last 3 Encounters:   06/08/24 159 lb (72.1 kg)  05/04/24 154 lb (69.9 kg)  01/13/24 154 lb 1.6 oz (69.9 kg)    Physical Exam Vitals and nursing note reviewed.    GENERAL: Alert, cooperative, well developed, no acute distress. HEENT: Normocephalic, normal oropharynx, moist mucous membranes. CHEST: Clear to auscultation bilaterally, no wheezes, rhonchi, or crackles. CARDIOVASCULAR: Normal heart rate and rhythm, S1 and S2 normal without murmurs. EXTREMITIES: No cyanosis or edema. NEUROLOGICAL: Cranial nerves grossly intact, moves all extremities without gross motor or sensory deficit.    Assessment & Plan:   Problem List Items Addressed This Visit       Cardiovascular and Mediastinum   Essential hypertension - Primary   Relevant Medications   valsartan  (DIOVAN ) 160 MG tablet   Other Relevant Orders   CBC with Differential/Platelet (Completed)   Comp. Metabolic Panel (12) (Completed)     Other   Hyperlipidemia   Relevant Medications   valsartan  (DIOVAN ) 160 MG tablet   Other Relevant Orders   Lipid panel (Completed)   Prediabetes   Relevant Orders   Hemoglobin A1c (Completed)   Other Visit Diagnoses       Screening PSA (prostate specific antigen)       Relevant Orders   PSA (Completed)       Assessment and Plan Essential hypertension Blood pressure slightly elevated due to missed valsartan  dose. - Refilled valsartan  prescription.  The patient was given clear instructions to go to ER or return to medical center if symptoms don't improve, worsen or new problems develop. The patient verbalized understanding.   Prediabetes Overdue for A1c testing. - Ordered A1c test.  Other hyperlipidemia Overdue for cholesterol testing. - Ordered cholesterol test.  Prostate cancer screening Due for screening. - Ordered prostate cancer screening test.   I have reviewed the patient's medical history (PMH, PSH, Social History, Family History, Medications, and allergies) , and have been  updated if relevant. I spent 30 minutes reviewing chart and  face to face time with patient.    Return in about 6 weeks (around 07/20/2024) for with Greig Drones, NP at [facility].    Kirk RAMAN Mayers, PA-C

## 2024-06-08 NOTE — Progress Notes (Signed)
 Erroneous encounter-disregard

## 2024-06-08 NOTE — Patient Instructions (Addendum)
 VISIT SUMMARY:  Today, you came in for a refill of your blood pressure medication. We discussed your current medications and noted that you are up to date on all except for your blood pressure medication, which you missed today. We also reviewed your need for some routine tests and screenings.  YOUR PLAN:  -ESSENTIAL HYPERTENSION: Essential hypertension means you have high blood pressure without a known secondary cause. Your blood pressure was slightly elevated today because you missed your dose of valsartan . We have refilled your valsartan  prescription. Please continue taking it as prescribed.  -PREDIABETES: Prediabetes means your blood sugar levels are higher than normal but not high enough to be classified as diabetes. You are overdue for an A1c test, which measures your average blood sugar levels over the past 3 months. We have ordered this test for you.  -OTHER HYPERLIPIDEMIA: Hyperlipidemia means you have high levels of fats (lipids) in your blood, such as cholesterol. You are overdue for a cholesterol test, and we have ordered this test for you.  -PROSTATE CANCER SCREENING: Prostate cancer screening is a test to check for signs of prostate cancer. You are due for this screening, and we have ordered the test for you.  Health Maintenance, Male Adopting a healthy lifestyle and getting preventive care are important in promoting health and wellness. Ask your health care provider about: The right schedule for you to have regular tests and exams. Things you can do on your own to prevent diseases and keep yourself healthy. What should I know about diet, weight, and exercise? Eat a healthy diet  Eat a diet that includes plenty of vegetables, fruits, low-fat dairy products, and lean protein. Do not eat a lot of foods that are high in solid fats, added sugars, or sodium. Maintain a healthy weight Body mass index (BMI) is a measurement that can be used to identify possible weight problems. It  estimates body fat based on height and weight. Your health care provider can help determine your BMI and help you achieve or maintain a healthy weight. Get regular exercise Get regular exercise. This is one of the most important things you can do for your health. Most adults should: Exercise for at least 150 minutes each week. The exercise should increase your heart rate and make you sweat (moderate-intensity exercise). Do strengthening exercises at least twice a week. This is in addition to the moderate-intensity exercise. Spend less time sitting. Even light physical activity can be beneficial. Watch cholesterol and blood lipids Have your blood tested for lipids and cholesterol at 74 years of age, then have this test every 5 years. You may need to have your cholesterol levels checked more often if: Your lipid or cholesterol levels are high. You are older than 74 years of age. You are at high risk for heart disease. What should I know about cancer screening? Many types of cancers can be detected early and may often be prevented. Depending on your health history and family history, you may need to have cancer screening at various ages. This may include screening for: Colorectal cancer. Prostate cancer. Skin cancer. Lung cancer. What should I know about heart disease, diabetes, and high blood pressure? Blood pressure and heart disease High blood pressure causes heart disease and increases the risk of stroke. This is more likely to develop in people who have high blood pressure readings or are overweight. Talk with your health care provider about your target blood pressure readings. Have your blood pressure checked: Every 3-5 years if  you are 84-39 years of age. Every year if you are 27 years old or older. If you are between the ages of 21 and 91 and are a current or former smoker, ask your health care provider if you should have a one-time screening for abdominal aortic aneurysm  (AAA). Diabetes Have regular diabetes screenings. This checks your fasting blood sugar level. Have the screening done: Once every three years after age 23 if you are at a normal weight and have a low risk for diabetes. More often and at a younger age if you are overweight or have a high risk for diabetes. What should I know about preventing infection? Hepatitis B If you have a higher risk for hepatitis B, you should be screened for this virus. Talk with your health care provider to find out if you are at risk for hepatitis B infection. Hepatitis C Blood testing is recommended for: Everyone born from [birth year] through 1966. Anyone with known risk factors for hepatitis C. Sexually transmitted infections (STIs) You should be screened each year for STIs, including gonorrhea and chlamydia, if: You are sexually active and are younger than 74 years of age. You are older than 74 years of age and your health care provider tells you that you are at risk for this type of infection. Your sexual activity has changed since you were last screened, and you are at increased risk for chlamydia or gonorrhea. Ask your health care provider if you are at risk. Ask your health care provider about whether you are at high risk for HIV. Your health care provider may recommend a prescription medicine to help prevent HIV infection. If you choose to take medicine to prevent HIV, you should first get tested for HIV. You should then be tested every 3 months for as long as you are taking the medicine. Follow these instructions at home: Alcohol use Do not drink alcohol if your health care provider tells you not to drink. If you drink alcohol: Limit how much you have to 0-2 drinks a day. Know how much alcohol is in your drink. In the [place] one drink equals one 12 oz bottle of beer (355 mL), one 5 oz glass of wine (148 mL), or one 1 oz glass of hard liquor (44 mL). Lifestyle Do not use any products that contain nicotine or  tobacco. These products include cigarettes, chewing tobacco, and vaping devices, such as e-cigarettes. If you need help quitting, ask your health care provider. Do not use street drugs. Do not share needles. Ask your health care provider for help if you need support or information about quitting drugs. General instructions Schedule regular health, dental, and eye exams. Stay current with your vaccines. Tell your health care provider if: You often feel depressed. You have ever been abused or do not feel safe at home. Summary Adopting a healthy lifestyle and getting preventive care are important in promoting health and wellness. Follow your health care provider's instructions about healthy diet, exercising, and getting tested or screened for diseases. Follow your health care provider's instructions on monitoring your cholesterol and blood pressure. This information is not intended to replace advice given to you by your health care provider. Make sure you discuss any questions you have with your health care provider. Document Revised: 11/20/2020 Document Reviewed: 11/20/2020 Elsevier Patient Education  2024 [employer].

## 2024-06-09 ENCOUNTER — Ambulatory Visit: Payer: Self-pay | Admitting: Physician Assistant

## 2024-06-09 LAB — CBC WITH DIFFERENTIAL/PLATELET
Basophils Absolute: 0 x10E3/uL (ref 0.0–0.2)
Basos: 1 %
EOS (ABSOLUTE): 0.2 x10E3/uL (ref 0.0–0.4)
Eos: 3 %
Hematocrit: 42.4 % (ref 37.5–51.0)
Hemoglobin: 13.6 g/dL (ref 13.0–17.7)
Immature Grans (Abs): 0 x10E3/uL (ref 0.0–0.1)
Immature Granulocytes: 0 %
Lymphocytes Absolute: 2 x10E3/uL (ref 0.7–3.1)
Lymphs: 34 %
MCH: 29.8 pg (ref 26.6–33.0)
MCHC: 32.1 g/dL (ref 31.5–35.7)
MCV: 93 fL (ref 79–97)
Monocytes Absolute: 0.6 x10E3/uL (ref 0.1–0.9)
Monocytes: 10 %
Neutrophils Absolute: 3.1 x10E3/uL (ref 1.4–7.0)
Neutrophils: 52 %
Platelets: 163 x10E3/uL (ref 150–450)
RBC: 4.57 x10E6/uL (ref 4.14–5.80)
RDW: 13.1 % (ref 11.6–15.4)
WBC: 6 x10E3/uL (ref 3.4–10.8)

## 2024-06-09 LAB — LIPID PANEL
Chol/HDL Ratio: 2.1 ratio (ref 0.0–5.0)
Cholesterol, Total: 132 mg/dL (ref 100–199)
HDL: 62 mg/dL (ref >39)
LDL Chol Calc (NIH): 58 mg/dL (ref 0–99)
Triglycerides: 52 mg/dL (ref 0–149)
VLDL Cholesterol Cal: 12 mg/dL (ref 5–40)

## 2024-06-09 LAB — COMP. METABOLIC PANEL (12)
AST: 28 IU/L (ref 0–40)
Albumin: 3.9 g/dL (ref 3.8–4.8)
Alkaline Phosphatase: 95 IU/L (ref 47–123)
BUN/Creatinine Ratio: 18 (ref 10–24)
BUN: 18 mg/dL (ref 8–27)
Bilirubin Total: 0.4 mg/dL (ref 0.0–1.2)
Calcium: 9.2 mg/dL (ref 8.6–10.2)
Chloride: 108 mmol/L — ABNORMAL HIGH (ref 96–106)
Creatinine, Ser: 1.02 mg/dL (ref 0.76–1.27)
Globulin, Total: 2.5 g/dL (ref 1.5–4.5)
Glucose: 93 mg/dL (ref 70–99)
Potassium: 4.7 mmol/L (ref 3.5–5.2)
Sodium: 142 mmol/L (ref 134–144)
Total Protein: 6.4 g/dL (ref 6.0–8.5)
eGFR: 78 mL/min/1.73 (ref >59)

## 2024-06-09 LAB — PSA: Prostate Specific Ag, Serum: 1.3 ng/mL (ref 0.0–4.0)

## 2024-06-09 LAB — HEMOGLOBIN A1C
Est. average glucose Bld gHb Est-mCnc: 126 mg/dL
Hgb A1c MFr Bld: 6 % — ABNORMAL HIGH (ref 4.8–5.6)

## 2024-06-09 NOTE — Progress Notes (Signed)
 Name and DOB verified. Pt is aware of lab results and providers recommendations

## 2024-07-19 ENCOUNTER — Other Ambulatory Visit: Payer: Self-pay | Admitting: Cardiovascular Disease

## 2024-07-19 DIAGNOSIS — E782 Mixed hyperlipidemia: Secondary | ICD-10-CM

## 2024-07-20 ENCOUNTER — Ambulatory Visit (INDEPENDENT_AMBULATORY_CARE_PROVIDER_SITE_OTHER): Admitting: Family

## 2024-07-20 ENCOUNTER — Encounter: Payer: Self-pay | Admitting: Family

## 2024-07-20 VITALS — BP 127/75 | HR 67 | Temp 98.9°F | Resp 16 | Ht 65.0 in | Wt 162.0 lb

## 2024-07-20 DIAGNOSIS — Z130 Encounter for screening for diseases of the blood and blood-forming organs and certain disorders involving the immune mechanism: Secondary | ICD-10-CM | POA: Diagnosis not present

## 2024-07-20 DIAGNOSIS — M25552 Pain in left hip: Secondary | ICD-10-CM

## 2024-07-20 DIAGNOSIS — Z1329 Encounter for screening for other suspected endocrine disorder: Secondary | ICD-10-CM | POA: Diagnosis not present

## 2024-07-20 DIAGNOSIS — Z0000 Encounter for general adult medical examination without abnormal findings: Secondary | ICD-10-CM

## 2024-07-20 DIAGNOSIS — Z13228 Encounter for screening for other metabolic disorders: Secondary | ICD-10-CM | POA: Diagnosis not present

## 2024-07-20 DIAGNOSIS — Z1211 Encounter for screening for malignant neoplasm of colon: Secondary | ICD-10-CM | POA: Diagnosis not present

## 2024-07-20 DIAGNOSIS — R2689 Other abnormalities of gait and mobility: Secondary | ICD-10-CM

## 2024-07-20 DIAGNOSIS — Z13220 Encounter for screening for lipoid disorders: Secondary | ICD-10-CM | POA: Diagnosis not present

## 2024-07-20 DIAGNOSIS — R296 Repeated falls: Secondary | ICD-10-CM

## 2024-07-20 NOTE — Progress Notes (Signed)
 "   Patient ID: Leonard Green, male    DOB: [birth year]  MRN: [id number]  CC: Annual Exam  Subjective: Leonard Green is a 74 y.o. male who presents for annual exam.  His concerns today include:  - Due for colon cancer screening.  - States 4 days ago he fell on his left hip. States he did not hit his head and he did not lose consciousness. States sometimes he feels off balance.  Patient Active Problem List   Diagnosis Date Noted   Essential hypertension 06/08/2024   Disequilibrium 12/03/2022   BPPV (benign paroxysmal positional vertigo), unspecified laterality 09/03/2022   Neck pain 09/03/2022   Vestibular migraine 09/03/2022   Other dysphagia 07/18/2022   Hyperlipidemia 03/07/2022   Prediabetes 03/07/2022   Spondylosis without myelopathy or radiculopathy, cervical region 02/19/2022   Fracture of femoral neck, left (HCC) 05/21/2011     Medications Ordered Prior to Encounter[1]  Allergies[2]  Social History   Socioeconomic History   Marital status: Single    Spouse name: Not on file   Number of children: Not on file   Years of education: Not on file   Highest education level: Not on file  Occupational History   Occupation: DISABLITY/RETIRED  Tobacco Use   Smoking status: Never    Passive exposure: Never   Smokeless tobacco: Never  Vaping Use   Vaping status: Never Used  Substance and Sexual Activity   Alcohol use: Never   Drug use: Never   Sexual activity: Not Currently  Other Topics Concern   Not on file  Social History Narrative   Lives with a roommate/2025   Social Drivers of Health   Tobacco Use: Low Risk (07/20/2024)   Patient History    Smoking Tobacco Use: Never    Smokeless Tobacco Use: Never    Passive Exposure: Never  Financial Resource Strain: Low Risk (05/04/2024)   Overall Financial Resource Strain (CARDIA)    Difficulty of Paying Living Expenses: Not very hard  Food Insecurity: No Food Insecurity (06/08/2024)   Epic    Worried About  Programme Researcher, Broadcasting/film/video in the Last Year: Never true    Ran Out of Food in the Last Year: Never true  Transportation Needs: No Transportation Needs (06/08/2024)   Epic    Lack of Transportation (Medical): No    Lack of Transportation (Non-Medical): No  Physical Activity: Sufficiently Active (05/04/2024)   Exercise Vital Sign    Days of Exercise per Week: 7 days    Minutes of Exercise per Session: 30 min  Stress: No Stress Concern Present (05/04/2024)   [employer] of Occupational Health - Occupational Stress Questionnaire    Feeling of Stress: Not at all  Social Connections: Moderately Integrated (05/04/2024)   Social Connection and Isolation Panel    Frequency of Communication with Friends and Family: More than three times a week    Frequency of Social Gatherings with Friends and Family: More than three times a week    Attends Religious Services: More than 4 times per year    Active Member of Clubs or Organizations: Yes    Attends Banker Meetings: More than 4 times per year    Marital Status: Never married  Intimate Partner Violence: Not At Risk (06/08/2024)   Epic    Fear of Current or Ex-Partner: No    Emotionally Abused: No    Physically Abused: No    Sexually Abused: No  Depression (PHQ2-9): Low Risk (07/20/2024)   Depression (  PHQ2-9)    PHQ-2 Score: 0  Alcohol Screen: Low Risk (05/04/2024)   Alcohol Screen    Last Alcohol Screening Score (AUDIT): 0  Housing: Low Risk (06/08/2024)   Epic    Unable to Pay for Housing in the Last Year: No    Number of Times Moved in the Last Year: 0    Homeless in the Last Year: No  Utilities: Not At Risk (06/08/2024)   Epic    Threatened with loss of utilities: No  Health Literacy: Adequate Health Literacy (05/04/2024)   B1300 Health Literacy    Frequency of need for help with medical instructions: Never    Family History  Problem Relation Age of Onset   Cancer Mother    Asthma Father    Cancer Father     Past  Surgical History:  Procedure Laterality Date   JOINT REPLACEMENT     hip    ROS: Review of Systems Negative except as stated above  PHYSICAL EXAM: BP 127/75   Pulse 67   Temp 98.9 F (37.2 C) (Oral)   Resp 16   Ht 5' 5 (1.651 m)   Wt 162 lb (73.5 kg)   SpO2 96%   BMI 26.96 kg/m   Physical Exam HENT:     Head: Normocephalic and atraumatic.     Right Ear: Tympanic membrane, ear canal and external ear normal.     Left Ear: Tympanic membrane, ear canal and external ear normal.     Nose: Nose normal.     Mouth/Throat:     Mouth: Mucous membranes are moist.     Pharynx: Oropharynx is clear.  Eyes:     Extraocular Movements: Extraocular movements intact.     Conjunctiva/sclera: Conjunctivae normal.     Pupils: Pupils are equal, round, and reactive to light.  Neck:     Thyroid: No thyroid mass, thyromegaly or thyroid tenderness.  Cardiovascular:     Rate and Rhythm: Normal rate and regular rhythm.     Pulses: Normal pulses.     Heart sounds: Normal heart sounds.  Pulmonary:     Effort: Pulmonary effort is normal.     Breath sounds: Normal breath sounds.  Abdominal:     General: Bowel sounds are normal.     Palpations: Abdomen is soft.  Genitourinary:    Comments: Patient declined. Musculoskeletal:        General: Normal range of motion.     Right shoulder: Normal.     Left shoulder: Normal.     Right upper arm: Normal.     Left upper arm: Normal.     Right elbow: Normal.     Left elbow: Normal.     Right forearm: Normal.     Left forearm: Normal.     Right wrist: Normal.     Left wrist: Normal.     Right hand: Normal.     Left hand: Normal.     Cervical back: Normal, normal range of motion and neck supple.     Thoracic back: Normal.     Lumbar back: Normal.     Right hip: Normal.     Left hip: Normal.     Right upper leg: Normal.     Left upper leg: Normal.     Right knee: Normal.     Left knee: Normal.     Right lower leg: Normal.     Left lower leg:  Normal.     Right ankle: Normal.  Left ankle: Normal.     Right foot: Normal.     Left foot: Normal.  Skin:    General: Skin is warm and dry.     Capillary Refill: Capillary refill takes less than 2 seconds.  Neurological:     General: No focal deficit present.     Mental Status: He is alert and oriented to person, place, and time.  Psychiatric:        Mood and Affect: Mood normal.        Behavior: Behavior normal.    ASSESSMENT AND PLAN: 1. Annual physical exam (Primary) - Counseled on 150 minutes of exercise per week as tolerated, healthy eating (including decreased daily intake of saturated fats, cholesterol, added sugars, sodium), STI prevention, and routine healthcare maintenance.  2. Screening for metabolic disorder - Routine screening.  - CMP14+EGFR  3. Screening for deficiency anemia - Routine screening.  - CBC  4. Screening cholesterol level - Routine screening.  - Lipid panel  5. Thyroid disorder screen - Routine screening.  - TSH  6. Colon cancer screening - Referral to Gastroenterology for colon cancer screening by colonoscopy. - Ambulatory referral to Gastroenterology  7. Imbalance 8. Frequent falls - Referral to Physical Therapy for evaluation/management.  - Ambulatory referral to Physical Therapy  9. Discomfort of left hip - Routine screening.  - DG Hip Unilat W OR W/O Pelvis 2-3 Views Left; Future   Patient was given the opportunity to ask questions.  Patient verbalized understanding of the plan and was able to repeat key elements of the plan. Patient was given clear instructions to go to Emergency Department or return to medical center if symptoms don't improve, worsen, or new problems develop.The patient verbalized understanding.   Orders Placed This Encounter  Procedures   DG Hip Unilat W OR W/O Pelvis 2-3 Views Left   CBC   Lipid panel   CMP14+EGFR   TSH   Ambulatory referral to Gastroenterology   Ambulatory referral to Physical  Therapy    Return in about 1 year (around 07/20/2025) for Physical per patient preference.  Greig JINNY Chute, NP      [1]  Current Outpatient Medications on File Prior to Visit  Medication Sig Dispense Refill   nortriptyline (PAMELOR) 25 MG capsule Take 25 mg by mouth at bedtime.     rosuvastatin  (CRESTOR ) 20 MG tablet TAKE 1 TABLET BY MOUTH EVERY DAY 90 tablet 3   valsartan  (DIOVAN ) 160 MG tablet Take 1 tablet (160 mg total) by mouth daily. 60 tablet 0   meclizine  (ANTIVERT ) 25 MG tablet Take by mouth. (Patient not taking: Reported on 07/20/2024)     nortriptyline (PAMELOR) 25 MG capsule Take 75 mg by mouth at bedtime.     No current facility-administered medications on file prior to visit.  [2] No Known Allergies  "

## 2024-07-20 NOTE — Progress Notes (Signed)
 Would like to have a physical He states he has had several falls d/t being off balance.

## 2024-07-21 ENCOUNTER — Ambulatory Visit: Payer: Self-pay | Admitting: Family

## 2024-07-21 LAB — CMP14+EGFR
ALT: 26 IU/L (ref 0–44)
AST: 27 IU/L (ref 0–40)
Albumin: 3.9 g/dL (ref 3.8–4.8)
Alkaline Phosphatase: 89 IU/L (ref 47–123)
BUN/Creatinine Ratio: 16 (ref 10–24)
BUN: 19 mg/dL (ref 8–27)
Bilirubin Total: 0.6 mg/dL (ref 0.0–1.2)
CO2: 24 mmol/L (ref 20–29)
Calcium: 8.9 mg/dL (ref 8.6–10.2)
Chloride: 104 mmol/L (ref 96–106)
Creatinine, Ser: 1.18 mg/dL (ref 0.76–1.27)
Globulin, Total: 2.4 g/dL (ref 1.5–4.5)
Glucose: 90 mg/dL (ref 70–99)
Potassium: 4.6 mmol/L (ref 3.5–5.2)
Sodium: 138 mmol/L (ref 134–144)
Total Protein: 6.3 g/dL (ref 6.0–8.5)
eGFR: 65 mL/min/1.73

## 2024-07-21 LAB — LIPID PANEL
Chol/HDL Ratio: 2.3 ratio (ref 0.0–5.0)
Cholesterol, Total: 152 mg/dL (ref 100–199)
HDL: 66 mg/dL
LDL Chol Calc (NIH): 73 mg/dL (ref 0–99)
Triglycerides: 62 mg/dL (ref 0–149)
VLDL Cholesterol Cal: 13 mg/dL (ref 5–40)

## 2024-07-21 LAB — CBC
Hematocrit: 40.8 % (ref 37.5–51.0)
Hemoglobin: 13.1 g/dL (ref 13.0–17.7)
MCH: 29.8 pg (ref 26.6–33.0)
MCHC: 32.1 g/dL (ref 31.5–35.7)
MCV: 93 fL (ref 79–97)
Platelets: 184 x10E3/uL (ref 150–450)
RBC: 4.39 x10E6/uL (ref 4.14–5.80)
RDW: 13 % (ref 11.6–15.4)
WBC: 7 x10E3/uL (ref 3.4–10.8)

## 2024-07-21 LAB — TSH: TSH: 1.08 u[IU]/mL (ref 0.450–4.500)

## 2024-07-22 ENCOUNTER — Ambulatory Visit (INDEPENDENT_AMBULATORY_CARE_PROVIDER_SITE_OTHER)

## 2024-07-22 DIAGNOSIS — M25552 Pain in left hip: Secondary | ICD-10-CM

## 2024-08-06 ENCOUNTER — Other Ambulatory Visit: Payer: Self-pay | Admitting: Family

## 2024-08-06 DIAGNOSIS — I10 Essential (primary) hypertension: Secondary | ICD-10-CM

## 2024-08-06 NOTE — Telephone Encounter (Signed)
 Requested Prescriptions  Pending Prescriptions Disp Refills   valsartan  (DIOVAN ) 160 MG tablet [Pharmacy Med Name: VALSARTAN  160 MG TABLET] 60 tablet 0    Sig: TAKE 1 TABLET BY MOUTH EVERY DAY     Cardiovascular:  Angiotensin Receptor Blockers Passed - 08/06/2024  2:40 PM      Passed - Cr in normal range and within 180 days    Creatinine, Ser  Date Value Ref Range Status  07/20/2024 1.18 0.76 - 1.27 mg/dL Final         Passed - K in normal range and within 180 days    Potassium  Date Value Ref Range Status  07/20/2024 4.6 3.5 - 5.2 mmol/L Final         Passed - Patient is not pregnant      Passed - Last BP in normal range    BP Readings from Last 1 Encounters:  07/20/24 127/75         Passed - Valid encounter within last 6 months    Recent Outpatient Visits           2 weeks ago Annual physical exam   [place] [facility], Amy J, NP   1 year ago Abnormal ankle brachial index (ABI)   [place] [facility], [place], NP   1 year ago Vertigo   [place] [facility], [place], NP   2 years ago Vertigo   [place] [facility], [place], NP   2 years ago Essential hypertension   [facility] Fleeta Tonia Garnette LITTIE, RPH-CPP

## 2025-07-20 ENCOUNTER — Encounter: Payer: Self-pay | Admitting: Family
